# Patient Record
Sex: Male | Born: 1965 | Hispanic: No | Marital: Married | Smoking: Never smoker
Health system: Southern US, Community
[De-identification: ages and names within clinical notes are randomized; demographics above are authoritative.]

## PROBLEM LIST (undated history)

## (undated) DIAGNOSIS — G4730 Sleep apnea, unspecified: Secondary | ICD-10-CM

## (undated) DIAGNOSIS — I639 Cerebral infarction, unspecified: Secondary | ICD-10-CM

## (undated) DIAGNOSIS — I77810 Thoracic aortic ectasia: Secondary | ICD-10-CM

## (undated) DIAGNOSIS — I10 Essential (primary) hypertension: Secondary | ICD-10-CM

## (undated) DIAGNOSIS — Z8673 Personal history of transient ischemic attack (TIA), and cerebral infarction without residual deficits: Secondary | ICD-10-CM

## (undated) DIAGNOSIS — E119 Type 2 diabetes mellitus without complications: Secondary | ICD-10-CM

## (undated) HISTORY — DX: Cerebral infarction, unspecified: I63.9

## (undated) HISTORY — DX: Essential (primary) hypertension: I10

## (undated) HISTORY — DX: Type 2 diabetes mellitus without complications: E11.9

## (undated) HISTORY — DX: Personal history of transient ischemic attack (TIA), and cerebral infarction without residual deficits: Z86.73

## (undated) HISTORY — DX: Sleep apnea, unspecified: G47.30

## (undated) HISTORY — DX: Thoracic aortic ectasia: I77.810

---

## 2002-04-30 ENCOUNTER — Emergency Department (HOSPITAL_COMMUNITY): Admission: EM | Admit: 2002-04-30 | Discharge: 2002-04-30 | Payer: Self-pay

## 2009-07-15 ENCOUNTER — Emergency Department (HOSPITAL_COMMUNITY): Admission: EM | Admit: 2009-07-15 | Discharge: 2009-07-15 | Payer: Self-pay | Admitting: Family Medicine

## 2009-07-19 ENCOUNTER — Ambulatory Visit (HOSPITAL_COMMUNITY): Admission: RE | Admit: 2009-07-19 | Discharge: 2009-07-19 | Payer: Self-pay | Admitting: Urology

## 2010-04-17 LAB — COMPREHENSIVE METABOLIC PANEL
ALT: 29 U/L (ref 0–53)
AST: 24 U/L (ref 0–37)
Albumin: 4.4 g/dL (ref 3.5–5.2)
Alkaline Phosphatase: 90 U/L (ref 39–117)
BUN: 12 mg/dL (ref 6–23)
CO2: 24 mEq/L (ref 19–32)
Calcium: 9 mg/dL (ref 8.4–10.5)
Chloride: 108 mEq/L (ref 96–112)
Creatinine, Ser: 0.93 mg/dL (ref 0.4–1.5)
GFR calc Af Amer: >60 mL/min (ref >60)
GFR calc non Af Amer: >60 mL/min (ref >60)
Glucose, Bld: 111 mg/dL — ABNORMAL HIGH (ref 70–99)
Potassium: 3.2 mEq/L — ABNORMAL LOW (ref 3.5–5.1)
Sodium: 140 mEq/L (ref 135–145)
Total Bilirubin: 0.8 mg/dL (ref 0.3–1.2)
Total Protein: 8.1 g/dL (ref 6.0–8.3)

## 2010-04-17 LAB — POCT URINALYSIS DIP (DEVICE)
Bilirubin Urine: NEGATIVE
Glucose, UA: NEGATIVE mg/dL
Ketones, ur: NEGATIVE mg/dL
Nitrite: NEGATIVE
Protein, ur: NEGATIVE mg/dL
Specific Gravity, Urine: 1.015 (ref 1.005–1.030)
Urobilinogen, UA: 0.2 mg/dL (ref 0.0–1.0)
pH: 7.5 (ref 5.0–8.0)

## 2010-04-17 LAB — POCT I-STAT, CHEM 8
BUN: 12 mg/dL (ref 6–23)
Calcium, Ion: 1.06 mmol/L — ABNORMAL LOW (ref 1.12–1.32)
Chloride: 110 mEq/L (ref 96–112)
Creatinine, Ser: 1 mg/dL (ref 0.4–1.5)
Glucose, Bld: 113 mg/dL — ABNORMAL HIGH (ref 70–99)
HCT: 47 % (ref 39.0–52.0)
Hemoglobin: 16 g/dL (ref 13.0–17.0)
Potassium: 3.3 mEq/L — ABNORMAL LOW (ref 3.5–5.1)
Sodium: 142 mEq/L (ref 135–145)
TCO2: 26 mmol/L (ref 0–100)

## 2010-04-17 LAB — DIFFERENTIAL
Basophils Absolute: 0 10*3/uL (ref 0.0–0.1)
Basophils Relative: 0 % (ref 0–1)
Eosinophils Absolute: 0.6 10*3/uL (ref 0.0–0.7)
Eosinophils Relative: 7 % — ABNORMAL HIGH (ref 0–5)
Lymphocytes Relative: 30 % (ref 12–46)
Lymphs Abs: 2.7 10*3/uL (ref 0.7–4.0)
Monocytes Absolute: 0.5 10*3/uL (ref 0.1–1.0)
Monocytes Relative: 5 % (ref 3–12)
Neutro Abs: 5.3 10*3/uL (ref 1.7–7.7)
Neutrophils Relative %: 58 % (ref 43–77)

## 2010-04-17 LAB — CBC
HCT: 46 % (ref 39.0–52.0)
Hemoglobin: 15.7 g/dL (ref 13.0–17.0)
MCHC: 34.1 g/dL (ref 30.0–36.0)
MCV: 80.3 fL (ref 78.0–100.0)
Platelets: 269 10*3/uL (ref 150–400)
RBC: 5.73 MIL/uL (ref 4.22–5.81)
RDW: 13.4 % (ref 11.5–15.5)
WBC: 9.1 10*3/uL (ref 4.0–10.5)

## 2010-04-17 LAB — STONE ANALYSIS: Stone Weight KSTONE: 0.086 g

## 2010-04-17 LAB — LIPASE, BLOOD: Lipase: 29 U/L (ref 11–59)

## 2013-05-25 ENCOUNTER — Ambulatory Visit (INDEPENDENT_AMBULATORY_CARE_PROVIDER_SITE_OTHER): Payer: BC Managed Care – PPO | Admitting: Emergency Medicine

## 2013-05-25 ENCOUNTER — Ambulatory Visit: Payer: BC Managed Care – PPO

## 2013-05-25 VITALS — BP 208/130 | HR 85 | Temp 98.2°F | Resp 16 | Ht 62.0 in | Wt 157.4 lb

## 2013-05-25 DIAGNOSIS — M549 Dorsalgia, unspecified: Secondary | ICD-10-CM

## 2013-05-25 DIAGNOSIS — I10 Essential (primary) hypertension: Secondary | ICD-10-CM

## 2013-05-25 MED ORDER — HYDROCHLOROTHIAZIDE 12.5 MG PO CAPS: 12.5000 mg | ORAL_CAPSULE | Freq: Every day | ORAL | Status: DC

## 2013-05-25 MED ORDER — AMLODIPINE BESYLATE 5 MG PO TABS: 5.0000 mg | ORAL_TABLET | Freq: Every day | ORAL | Status: DC

## 2013-05-25 MED ORDER — CYCLOBENZAPRINE HCL 10 MG PO TABS: 10.0000 mg | ORAL_TABLET | Freq: Three times a day (TID) | ORAL | Status: DC | PRN

## 2013-05-25 MED ORDER — NAPROXEN SODIUM 550 MG PO TABS: 550.0000 mg | ORAL_TABLET | Freq: Two times a day (BID) | ORAL | Status: AC
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
## 2013-08-16 ENCOUNTER — Other Ambulatory Visit: Payer: Self-pay | Admitting: Emergency Medicine

## 2013-08-17 NOTE — Telephone Encounter (Signed)
Spoke with male who translated. Will have pt RTC-15 days sent

## 2015-02-25 ENCOUNTER — Observation Stay (HOSPITAL_COMMUNITY): Payer: BLUE CROSS/BLUE SHIELD

## 2015-02-25 ENCOUNTER — Emergency Department (HOSPITAL_COMMUNITY): Payer: BLUE CROSS/BLUE SHIELD

## 2015-02-25 ENCOUNTER — Encounter (HOSPITAL_COMMUNITY): Payer: Self-pay | Admitting: Emergency Medicine

## 2015-02-25 ENCOUNTER — Observation Stay (HOSPITAL_COMMUNITY)
Admission: EM | Admit: 2015-02-25 | Discharge: 2015-02-27 | Disposition: A | Discharge status: Home / Self Care | Payer: BLUE CROSS/BLUE SHIELD | Attending: Internal Medicine | Admitting: Internal Medicine

## 2015-02-25 DIAGNOSIS — G451 Carotid artery syndrome (hemispheric): Secondary | ICD-10-CM | POA: Diagnosis not present

## 2015-02-25 DIAGNOSIS — Z79899 Other long term (current) drug therapy: Secondary | ICD-10-CM | POA: Diagnosis not present

## 2015-02-25 DIAGNOSIS — I159 Secondary hypertension, unspecified: Secondary | ICD-10-CM

## 2015-02-25 DIAGNOSIS — I712 Thoracic aortic aneurysm, without rupture: Secondary | ICD-10-CM | POA: Insufficient documentation

## 2015-02-25 DIAGNOSIS — I160 Hypertensive urgency: Principal | ICD-10-CM | POA: Insufficient documentation

## 2015-02-25 DIAGNOSIS — I639 Cerebral infarction, unspecified: Secondary | ICD-10-CM

## 2015-02-25 DIAGNOSIS — R0789 Other chest pain: Secondary | ICD-10-CM | POA: Diagnosis not present

## 2015-02-25 DIAGNOSIS — I77810 Thoracic aortic ectasia: Secondary | ICD-10-CM | POA: Diagnosis present

## 2015-02-25 DIAGNOSIS — R079 Chest pain, unspecified: Secondary | ICD-10-CM | POA: Diagnosis present

## 2015-02-25 DIAGNOSIS — Z8673 Personal history of transient ischemic attack (TIA), and cerebral infarction without residual deficits: Secondary | ICD-10-CM

## 2015-02-25 DIAGNOSIS — R200 Anesthesia of skin: Secondary | ICD-10-CM | POA: Diagnosis not present

## 2015-02-25 DIAGNOSIS — Z9119 Patient's noncompliance with other medical treatment and regimen: Secondary | ICD-10-CM

## 2015-02-25 DIAGNOSIS — E876 Hypokalemia: Secondary | ICD-10-CM | POA: Diagnosis present

## 2015-02-25 DIAGNOSIS — G459 Transient cerebral ischemic attack, unspecified: Secondary | ICD-10-CM

## 2015-02-25 DIAGNOSIS — Z91199 Patient's noncompliance with other medical treatment and regimen due to unspecified reason: Secondary | ICD-10-CM

## 2015-02-25 DIAGNOSIS — M6289 Other specified disorders of muscle: Secondary | ICD-10-CM | POA: Diagnosis not present

## 2015-02-25 DIAGNOSIS — I10 Essential (primary) hypertension: Secondary | ICD-10-CM | POA: Diagnosis not present

## 2015-02-25 DIAGNOSIS — Z9114 Patient's other noncompliance with medication regimen: Secondary | ICD-10-CM | POA: Insufficient documentation

## 2015-02-25 DIAGNOSIS — R0602 Shortness of breath: Secondary | ICD-10-CM | POA: Diagnosis not present

## 2015-02-25 DIAGNOSIS — R059 Cough, unspecified: Secondary | ICD-10-CM | POA: Diagnosis present

## 2015-02-25 DIAGNOSIS — R531 Weakness: Secondary | ICD-10-CM | POA: Diagnosis present

## 2015-02-25 DIAGNOSIS — R05 Cough: Secondary | ICD-10-CM | POA: Diagnosis present

## 2015-02-25 HISTORY — DX: Essential (primary) hypertension: I10

## 2015-02-25 HISTORY — DX: Thoracic aortic ectasia: I77.810

## 2015-02-25 LAB — BASIC METABOLIC PANEL
Anion gap: 12 (ref 5–15)
BUN: 14 mg/dL (ref 6–20)
CO2: 22 mmol/L (ref 22–32)
Calcium: 9 mg/dL (ref 8.9–10.3)
Chloride: 105 mmol/L (ref 101–111)
Creatinine, Ser: 0.87 mg/dL (ref 0.61–1.24)
GFR calc Af Amer: >60 mL/min (ref >60)
GFR calc non Af Amer: >60 mL/min (ref >60)
Glucose, Bld: 116 mg/dL — ABNORMAL HIGH (ref 65–99)
Potassium: 3.3 mmol/L — ABNORMAL LOW (ref 3.5–5.1)
Sodium: 139 mmol/L (ref 135–145)

## 2015-02-25 LAB — CBC
HCT: 42.5 % (ref 39.0–52.0)
Hemoglobin: 14.5 g/dL (ref 13.0–17.0)
MCH: 25.4 pg — ABNORMAL LOW (ref 26.0–34.0)
MCHC: 34.1 g/dL (ref 30.0–36.0)
MCV: 74.6 fL — ABNORMAL LOW (ref 78.0–100.0)
Platelets: 351 10*3/uL (ref 150–400)
RBC: 5.7 MIL/uL (ref 4.22–5.81)
RDW: 12.8 % (ref 11.5–15.5)
WBC: 8.5 10*3/uL (ref 4.0–10.5)

## 2015-02-25 LAB — FERRITIN: Ferritin: 316 ng/mL (ref 24–336)

## 2015-02-25 LAB — TSH: TSH: 0.691 u[IU]/mL (ref 0.350–4.500)

## 2015-02-25 LAB — IRON AND TIBC
Iron: 82 ug/dL (ref 45–182)
Saturation Ratios: 23 % (ref 17.9–39.5)
TIBC: 364 ug/dL (ref 250–450)
UIBC: 282 ug/dL

## 2015-02-25 LAB — TROPONIN I
Troponin I: <0.03 ng/mL (ref <0.031)
Troponin I: <0.03 ng/mL (ref <0.031)

## 2015-02-25 LAB — BRAIN NATRIURETIC PEPTIDE: B Natriuretic Peptide: 39.2 pg/mL (ref 0.0–100.0)

## 2015-02-25 LAB — I-STAT TROPONIN, ED: Troponin i, poc: 0 ng/mL (ref 0.00–0.08)

## 2015-02-25 LAB — CBG MONITORING, ED: Glucose-Capillary: 112 mg/dL — ABNORMAL HIGH (ref 65–99)

## 2015-02-25 MED ORDER — LABETALOL HCL 5 MG/ML IV SOLN
20.0000 mg | Freq: Once | INTRAVENOUS | Status: AC
  Administered 2015-02-25: 20 mg via INTRAVENOUS
  Filled 2015-02-25: qty 4

## 2015-02-25 MED ORDER — AMLODIPINE BESYLATE 5 MG PO TABS
5.0000 mg | ORAL_TABLET | Freq: Every day | ORAL | Status: DC
  Administered 2015-02-25: 5 mg via ORAL
  Filled 2015-02-25: qty 1

## 2015-02-25 MED ORDER — IOHEXOL 350 MG/ML SOLN
100.0000 mL | Freq: Once | INTRAVENOUS | Status: AC | PRN
  Administered 2015-02-25: 100 mL via INTRAVENOUS

## 2015-02-25 MED ORDER — POTASSIUM CHLORIDE CRYS ER 20 MEQ PO TBCR
40.0000 meq | EXTENDED_RELEASE_TABLET | Freq: Once | ORAL | Status: AC
  Administered 2015-02-25: 40 meq via ORAL
  Filled 2015-02-25: qty 2

## 2015-02-25 MED ORDER — ASPIRIN 81 MG PO CHEW
324.0000 mg | CHEWABLE_TABLET | Freq: Once | ORAL | Status: AC
  Administered 2015-02-25: 324 mg via ORAL
  Filled 2015-02-25: qty 4

## 2015-02-25 MED ORDER — HYDRALAZINE HCL 20 MG/ML IJ SOLN
10.0000 mg | Freq: Four times a day (QID) | INTRAMUSCULAR | Status: DC | PRN
  Administered 2015-02-25: 10 mg via INTRAVENOUS
  Filled 2015-02-25: qty 1

## 2015-02-25 NOTE — ED Provider Notes (Signed)
CSN: [id number]     Arrival date & time 02/25/15  0940 History   First MD Initiated Contact with Patient 02/25/15 [phone]     Chief Complaint  Patient presents with  . Chest Pain     (Consider location/radiation/quality/duration/timing/severity/associated sxs/prior Treatment) HPI   Patient is a 50 year old male with past medical history of hypertension who presents to the ED with complaint of chest pain, onset 9 AM. Patient states while he was at work doing [employer] he began having left-sided chest tightness that he notes lasted for approximately 15 minutes. Denies radiation, denies any aggravating or alleviating factors. Patient notes during this episode he had associated diaphoresis, shortness of breath, lightheadedness, headache. He also reports having numbness and weakness to his right arm and leg which she notes has resolved since arrival to the ED. Denies fever, chills, cough, palpitations, abdominal pain, nausea, vomiting, diarrhea, urinary symptoms, syncope, seizure. Patient reports he has not been on his blood pressure medication for the past 2 years.  History reviewed. No pertinent past medical history. History reviewed. No pertinent past surgical history. No family history on file. Social History  Substance Use Topics  . Smoking status: Never Smoker   . Smokeless tobacco: None  . Alcohol Use: No    Review of Systems  Constitutional: Positive for diaphoresis.  Respiratory: Positive for chest tightness and shortness of breath.   Cardiovascular: Positive for chest pain.  Neurological: Positive for weakness, light-headedness, numbness and headaches.  All other systems reviewed and are negative.     Allergies  Review of patient's allergies indicates no known allergies.  Home Medications   Prior to Admission medications   Medication Sig Start Date End Date Taking? Authorizing Provider  amLODipine (NORVASC) 5 MG tablet take 1 tablet by mouth once daily   Yes Carmelina Dane, MD  guaiFENesin (MUCINEX) 600 MG 12 hr tablet Take 600 mg by mouth 2 (two) times daily.   Yes Historical Provider, MD  guaiFENesin (ROBITUSSIN) 100 MG/5ML liquid Take 200 mg by mouth 3 (three) times daily as needed for cough.   Yes Historical Provider, MD  hydrochlorothiazide (MICROZIDE) 12.5 MG capsule take 1 capsule by mouth once daily   Yes Carmelina Dane, MD  ibuprofen (ADVIL,MOTRIN) 200 MG tablet Take 200 mg by mouth every 6 (six) hours as needed for moderate pain.   Yes Historical Provider, MD  cyclobenzaprine (FLEXERIL) 10 MG tablet Take 1 tablet (10 mg total) by mouth 3 (three) times daily as needed for muscle spasms. Patient not taking: Reported on 02/25/2015 05/25/13   Carmelina Dane, MD   BP 184/96 mmHg  Pulse 80  Temp(Src) 97.6 F (36.4 C) (Oral)  Resp 21  SpO2 96% Physical Exam  Constitutional: He is oriented to person, place, and time. He appears well-developed and well-nourished. No distress.  HENT:  Head: Normocephalic and atraumatic.  Mouth/Throat: Oropharynx is clear and moist. No oropharyngeal exudate.  Eyes: Conjunctivae and EOM are normal. Pupils are equal, round, and reactive to light. Right eye exhibits no discharge. Left eye exhibits no discharge. No scleral icterus.  Neck: Normal range of motion. Neck supple.  Cardiovascular: Normal rate, regular rhythm, normal heart sounds and intact distal pulses.   Pulmonary/Chest: Effort normal and breath sounds normal. No respiratory distress. He has no wheezes. He has no rales. He exhibits no tenderness.  Abdominal: Soft. Bowel sounds are normal. He exhibits no distension and no mass. There is no tenderness. There is no rebound and no guarding.  Musculoskeletal: Normal range of motion. He exhibits no edema or tenderness.  Lymphadenopathy:    He has no cervical adenopathy.  Neurological: He is alert and oriented to person, place, and time. He has normal strength. No cranial nerve deficit or sensory deficit. He  displays a negative Romberg sign. Coordination normal.  Skin: Skin is warm and dry. He is not diaphoretic.  Nursing note and vitals reviewed.   ED Course  Procedures (including critical care time) Labs Review Labs Reviewed  BASIC METABOLIC PANEL - Abnormal; Notable for the following:    Potassium 3.3 (*)    Glucose, Bld 116 (*)    All other components within normal limits  CBC - Abnormal; Notable for the following:    MCV 74.6 (*)    MCH 25.4 (*)    All other components within normal limits  CBG MONITORING, ED - Abnormal; Notable for the following:    Glucose-Capillary 112 (*)    All other components within normal limits  TROPONIN I  TROPONIN I  TSH  I-STAT TROPOININ, ED    Imaging Review Dg Chest 2 View  02/25/2015  CLINICAL DATA:  Chest and left arm pain. EXAM: CHEST  2 VIEW COMPARISON:  None. FINDINGS: There is a torturous thoracic aorta with prominence of the ascending aorta. No pneumothorax. The heart, hila, and remainder of the mediastinum are normal. No pulmonary nodules, masses, or infiltrates. No other acute abnormalities. IMPRESSION: There is a tortuous and prominent ascending thoracic aorta. If there is concern for aortic pathology, a CTA of the chest could better evaluate. No other abnormalities. Electronically Signed   By: Gerome Sam III M.D   On: 02/25/2015 10:45   Ct Head Wo Contrast  02/25/2015  CLINICAL DATA:  Dizziness and right-sided weakness with inability to walk this morning. EXAM: CT HEAD WITHOUT CONTRAST TECHNIQUE: Contiguous axial images were obtained from the base of the skull through the vertex without intravenous contrast. COMPARISON:  None. FINDINGS: 1.3 cm triangular hypodensity peripherally in the left cerebellum image 8 series 2, suspicious for a remote infarct. There is likely some volume averaging along its lateral margin. Along the posterior inferior margin of the left lentiform nucleus, a 7 by 6 mm hypodensity suspicious for a remote lacunar  infarct is present, image 14 series 2. Otherwise, the brainstem, cerebellum, cerebral peduncles, thalami, basal ganglia, basilar cisterns, and ventricular system appear within normal limits. No intracranial hemorrhage, mass lesion, or acute CVA. Mild chronic ethmoid sinusitis observed. No middle ear fluid or mastoid effusion, although the mastoid air cells are aplastic on the left. IMPRESSION: 1. 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 Small suspected remote lacunar infarct in the left lentiform nucleus. Correlate with risk factors for cerebral vascular disease in determining need for further workup. I do not see any acute intracranial findings. 2. Mild chronic ethmoid sinusitis. Electronically Signed   By: Gaylyn Rong M.D.   On: 02/25/2015 11:15   Ct Angio Chest Aorta W/cm &/or Wo/cm  02/25/2015  CLINICAL DATA:  Palpitations. Right-sided weakness. Dizziness. Rule out dissection. EXAM: CT ANGIOGRAPHY CHEST WITH CONTRAST TECHNIQUE: Multidetector CT imaging of the chest was performed using the standard protocol during bolus administration of intravenous contrast. Multiplanar CT image reconstructions and MIPs were obtained to evaluate the vascular anatomy. CONTRAST:  OMNIPAQUE IOHEXOL 350 MG/ML SOLN COMPARISON:  07/16/2009 FINDINGS: Maximal aortic diameter at the sinus of Valsalva, sino-tubular junction, and ascending aorta are 4.4 cm, 4.0 cm, and 4.2 cm. No evidence of  dissection or intramural hematoma. No abnormal mediastinal adenopathy No obvious acute pulmonary thromboembolism. No acute vertebral body compression deformity. Review of the MIP images confirms the above findings. IMPRESSION: No evidence of aortic dissection. Mild aneurysmal dilatation of the ascending aorta at 4.2 cm. Recommend annual imaging followup by CTA or MRA. This recommendation follows 2010 ACCF/AHA/AATS/ACR/ASA/SCA/SCAI/SIR/STS/SVM Guidelines for the Diagnosis and Management of Patients with Thoracic  Aortic Disease. Circulation. 2010; 121: W098-J191 Electronically Signed   By: Jolaine Click M.D.   On: 02/25/2015 13:23   I have personally reviewed and evaluated these images and lab results as part of my medical decision-making.   EKG Interpretation None      MDM   Final diagnoses:  Chest pain  Secondary hypertension, unspecified    Pt presents with chest pain/tightness, diaphoresis, SOB, lightheadedness and HA that lasted appx. 15 min. He also endorses numbness and weakness to his right arm and leg during the episode also lasting appx. 15 min. Hx of uncontrolled HTN. Pt reports he has not been on his BP meds for about 2 years. Afebrile, HR 89, BP 202/116, O2 97% on RA. Exam unremarkable. No neuro deficits.   Pt given 324 ASA and  labetalol in the ED. EKG showed sinus rhythm. Trop negative. Potassium 3.3, labs otherwise unremarkable. CXR showed tortuous and prominent ascending thoracic aorta. CT head w/o showed suspected small old left posterior cerebellar infarct, small suspected remote lacunar infarct in left lentiform nucleaus, no acute findings. Plan to order CTA chest, neck and head.  12:10 - I spoke with the radiologist, Dr. Debe Coder, regarding the CT angio imaging ordered. He recommended that image the chest for a dissection and if any abnormality was seen in the thoracic aorta then the neck would also be included in the imaging. He advised that he did not think a CT angio head was recommended at this time based off the results from the CT head showing evidence of vascular disease, old infarct and no acute bleed.  CTA Chest Aorta showed no evidence of dissection, mild aneurysmal dilatation of ascending aorta. On reexamination BP decreased to 184/96. Plan to consult hospitalist for admission. I spoke with [facility], Junious Silk who agrees to admission with request to consult neuro. Orders placed for obs. Telemetry bed. Consulted neurology, I spoke with Dr. Lavon Paganini who will  evaluate the pt in the ED. Dr. Lavon Paganini discussed plan to further evaluate the pt for stroke workup. Discussed results and plan for admission with pt.    Satira Sark [place], [place] 02/25/15 1437  Loren Racer, MD 02/26/15 [phone]

## 2015-02-25 NOTE — Consult Note (Signed)
Requesting Physician: Dr. Malachi Bonds     Reason for consultation: Transient ischemic event  HPI:                                                                                                                                         Fred Brown is an 50 y.o. male patient who presented following a 15 minute episode of right upper lower extremity weakness and numbness. Symptoms have completely resolved and is a symptomatic at this time. Patient is [place] and speaks good English and able to provide history. He works in [employer]. He stopped taking his antihypertensive medications couple of months ago and reports that cost being a reason.  Date last known well:  02/25/15 Time last known well:  9 am tPA Given: No: Symptoms completely resolved  Stroke Risk Factors - hypertension  Past Medical History: History reviewed. No pertinent past medical history.  History reviewed. No pertinent past surgical history.  Family History: No family history on file.  Social History:   reports that he has never smoked. He does not have any smokeless tobacco history on file. He reports that he does not drink alcohol or use illicit drugs.  Allergies:  No Known Allergies   Medications:                                                                                                                         Current facility-administered medications:  .  amLODipine (NORVASC) tablet 5 mg, 5 mg, Oral, Daily, Russella Dar, NP, 5 mg at 02/25/15 1434 .  hydrALAZINE (APRESOLINE) injection 10 mg, 10 mg, Intravenous, Q6H PRN, Russella Dar, NP .  potassium chloride SA (K-DUR,KLOR-CON) CR tablet 40 mEq, 40 mEq, Oral, Once, Russella Dar, NP  Current outpatient prescriptions:  .  amLODipine (NORVASC) 5 MG tablet, take 1 tablet by mouth once daily, Disp: 15 tablet, Rfl: 0 .  guaiFENesin (MUCINEX) 600 MG 12 hr tablet, Take 600 mg by mouth 2 (two) times daily., Disp: , Rfl:  .  guaiFENesin (ROBITUSSIN) 100 MG/5ML  liquid, Take 200 mg by mouth 3 (three) times daily as needed for cough., Disp: , Rfl:  .  hydrochlorothiazide (MICROZIDE) 12.5 MG capsule, take 1 capsule by mouth once daily, Disp: 15 capsule, Rfl: 0 .  ibuprofen (ADVIL,MOTRIN) 200 MG tablet, Take 200 mg by mouth every 6 (six) hours as needed  for moderate pain., Disp: , Rfl:  .  cyclobenzaprine (FLEXERIL) 10 MG tablet, Take 1 tablet (10 mg total) by mouth 3 (three) times daily as needed for muscle spasms. (Patient not taking: Reported on 02/25/2015), Disp: 30 tablet, Rfl: 0   ROS:                                                                                                                                       History obtained from the patient  General ROS: negative for - chills, fatigue, fever, night sweats, weight gain or weight loss Psychological ROS: negative for - behavioral disorder, hallucinations, memory difficulties, mood swings or suicidal ideation Ophthalmic ROS: negative for - blurry vision, double vision, eye pain or loss of vision ENT ROS: negative for - epistaxis, nasal discharge, oral lesions, sore throat, tinnitus or vertigo Allergy and Immunology ROS: negative for - hives or itchy/watery eyes Hematological and Lymphatic ROS: negative for - bleeding problems, bruising or swollen lymph nodes Endocrine ROS: negative for - galactorrhea, hair pattern changes, polydipsia/polyuria or temperature intolerance Respiratory ROS: negative for - cough, hemoptysis, shortness of breath or wheezing Cardiovascular ROS: negative for - chest pain, dyspnea on exertion, edema or irregular heartbeat Gastrointestinal ROS: negative for - abdominal pain, diarrhea, hematemesis, nausea/vomiting or stool incontinence Genito-Urinary ROS: negative for - dysuria, hematuria, incontinence or urinary frequency/urgency Musculoskeletal ROS: negative for - joint swelling or muscular weakness Neurological ROS: as noted in HPI Dermatological ROS: negative for rash  and skin lesion changes  Neurologic Examination:                                                                                                      Blood pressure 193/113, pulse 85, temperature 97.6 F (36.4 C), temperature source Oral, resp. rate 23, SpO2 97 %.  Evaluation of higher integrative functions including: Level of alertness: Alert,  Oriented to time, place and person Recent and remote memory - intact   Attention span and concentration  - intact   Speech: fluent, no evidence of dysarthria or aphasia noted.  Test the following cranial nerves: 2-12 grossly intact Motor examination: Normal tone, bulk, full 5/5 motor strength in all 4 extremities Examination of sensation : Normal and symmetric sensation to pinprick in all 4 extremities and on face Examination of deep tendon reflexes: 2+, normal and symmetric in all extremities, normal plantars bilaterally Test coordination: Normal finger nose testing, with no evidence of limb appendicular ataxia or abnormal involuntary movements or tremors noted.  Gait: Normal   Lab Results: Basic Metabolic Panel:  Recent Labs Lab 02/25/15 0953  NA 139  K 3.3*  CL 105  CO2 22  GLUCOSE 116*  BUN 14  CREATININE 0.87  CALCIUM 9.0    Liver Function Tests: No results for input(s): AST, ALT, ALKPHOS, BILITOT, PROT, ALBUMIN in the last 168 hours. No results for input(s): LIPASE, AMYLASE in the last 168 hours. No results for input(s): AMMONIA in the last 168 hours.  CBC:  Recent Labs Lab 02/25/15 0953  WBC 8.5  HGB 14.5  HCT 42.5  MCV 74.6*  PLT 351    Cardiac Enzymes: No results for input(s): CKTOTAL, CKMB, CKMBINDEX, TROPONINI in the last 168 hours.  Lipid Panel: No results for input(s): CHOL, TRIG, HDL, CHOLHDL, VLDL, LDLCALC in the last 168 hours.  CBG:  Recent Labs Lab 02/25/15 1001  GLUCAP 112*    Microbiology: Results for orders placed or performed during the hospital encounter of 07/19/09  Stone analysis      Status: None   Collection Time: 07/19/09 10:45 AM  Result Value Ref Range Status   Component 1 KSTONE   Final    See Below (NOTE) Calcium Oxalate Dihydrate ([place]) 50% Carbonate Apatite ([place]) 50%   Stone Weight KSTONE 0.0860 g Final     Imaging: Dg Chest 2 View  02/25/2015  CLINICAL DATA:  Chest and left arm pain. EXAM: CHEST  2 VIEW COMPARISON:  None. FINDINGS: There is a torturous thoracic aorta with prominence of the ascending aorta. No pneumothorax. The heart, hila, and remainder of the mediastinum are normal. No pulmonary nodules, masses, or infiltrates. No other acute abnormalities. IMPRESSION: There is a tortuous and prominent ascending thoracic aorta. If there is concern for aortic pathology, a CTA of the chest could better evaluate. No other abnormalities. Electronically Signed   By: Gerome Sam III M.D   On: 02/25/2015 10:45   Ct Head Wo Contrast  02/25/2015  CLINICAL DATA:  Dizziness and right-sided weakness with inability to walk this morning. EXAM: CT HEAD WITHOUT CONTRAST TECHNIQUE: Contiguous axial images were obtained from the base of the skull through the vertex without intravenous contrast. COMPARISON:  None. FINDINGS: 1.3 cm triangular hypodensity peripherally in the left cerebellum image 8 series 2, suspicious for a remote infarct. There is likely some volume averaging along its lateral margin. Along the posterior inferior margin of the left lentiform nucleus, a 7 by 6 mm hypodensity suspicious for a remote lacunar infarct is present, image 14 series 2. Otherwise, the brainstem, cerebellum, cerebral peduncles, thalami, basal ganglia, basilar cisterns, and ventricular system appear within normal limits. No intracranial hemorrhage, mass lesion, or acute CVA. Mild chronic ethmoid sinusitis observed. No middle ear fluid or mastoid effusion, although the mastoid air cells are aplastic on the left. IMPRESSION: 1. 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 Small suspected remote lacunar infarct in the left lentiform nucleus. Correlate with risk factors for cerebral vascular disease in determining need for further workup. I do not see any acute intracranial findings. 2. Mild chronic ethmoid sinusitis. Electronically Signed   By: Gaylyn Rong M.D.   On: 02/25/2015 11:15   Ct Angio Chest Aorta W/cm &/or Wo/cm  02/25/2015  CLINICAL DATA:  Palpitations. Right-sided weakness. Dizziness. Rule out dissection. EXAM: CT ANGIOGRAPHY CHEST WITH CONTRAST TECHNIQUE: Multidetector CT imaging of the chest was performed using the standard protocol during bolus administration of intravenous contrast. Multiplanar CT image reconstructions and MIPs were obtained to evaluate  the vascular anatomy. CONTRAST:  OMNIPAQUE IOHEXOL 350 MG/ML SOLN COMPARISON:  07/16/2009 FINDINGS: Maximal aortic diameter at the sinus of Valsalva, sino-tubular junction, and ascending aorta are 4.4 cm, 4.0 cm, and 4.2 cm. No evidence of dissection or intramural hematoma. No abnormal mediastinal adenopathy No obvious acute pulmonary thromboembolism. No acute vertebral body compression deformity. Review of the MIP images confirms the above findings. IMPRESSION: No evidence of aortic dissection. Mild aneurysmal dilatation of the ascending aorta at 4.2 cm. Recommend annual imaging followup by CTA or MRA. This recommendation follows 2010 ACCF/AHA/AATS/ACR/ASA/SCA/SCAI/SIR/STS/SVM Guidelines for the Diagnosis and Management of Patients with Thoracic Aortic Disease. Circulation. 2010; 121: B147-W295 Electronically Signed   By: Jolaine Click M.D.   On: 02/25/2015 13:23    Assessment and plan:   Gurvir Schrom is an 50 y.o. male patient who presented to the ER following a 15 minute episode of right upper and lower extremity weakness and numbness. Symptoms are completely resolved, and is a symptomatic at this time. He does have poorly controlled hypertension with systolic blood pressures greater than 200 in  the ER. He stopped taking his antihypertensive medications 2 months ago, citing high cost. 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 Recommend further TIA workup with MRI of the brain, MRA of the head, ultrasound of the carotids, and transthoracic echocardiogram with bubble study. We will be placed on cardiac telemetry to rule out atrial fibrillation. Check A1c, lipid profile. Start aspirin 325 mg daily.  Neurology service will continue to follow up during her hospitalization. Please call for any further questions.

## 2015-02-25 NOTE — ED Notes (Signed)
Patient sent off the off by another staff member.

## 2015-02-25 NOTE — Progress Notes (Signed)
Attending I have personally interviewed and examined the patient and the treatment plan was discussed with the physician extender.  I have directly reviewed the clinical findings, lab results, imaging studies of this patient.  I have reviewed and agree with the documentation as recorded by the physician extender with any exceptions or changes to the plan listed below.    The patient is a 50 year old male with history of hypertension, medication noncompliance who presents with left-sided chest pain and an episode of sudden onset difficult moving the right arm and right leg.  In the emergency department, his blood pressure was initially 219/147.  Labs were notable for mild hypokalemia. Initial troponin was negative, EKG demonstrated no acute ischemic changes, NSR.  Chest x-ray demonstrated a tortuous aorta without acute abnormality and follow-up CT angiogram chest demonstrate no evidence of aortic dissection. 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   Gen:  Adult male, NAD HEENT:  NCAT, PERRL, MMM CV:  RRR, no mrg PULM:  CTAB ABD:  NABS, soft, ND/NT MSK:  No LEE, normal tone and bulk Neuro:  CN II-XII grossly intact, strength 5/5 throughout, sensation intact to light touch, no dysmetria, stable gait.    Assessment/plan: Malignant hypertension, blood pressure trending down after 1 dose of IV labetalol in emergency permit. Patient will be admitted observation for blood pressure management. Cycle troponins, resume previous blood pressure medications.  Will also work up for TIA/stroke given transient neurologic symptoms with evidence of previous strokes on CT head with MRI/MRA brain, ECHO, carotid duplex, PT/OT/SLP.  Bedside swallow evaluation.  Start aspirin and high dose  statin.  Mild microcytosis without anemia.  Check iron studies and occult stool.

## 2015-02-25 NOTE — ED Notes (Signed)
PA at bedside. Patient now reporting some right sided numbness this am that has now resolved to just pain.

## 2015-02-25 NOTE — Care Management Note (Signed)
Case Management Note  Patient Details  Name: Lennie Vasco MRN: [id number] Date of Birth: 07/01/1965  Subjective/Objective:    CP with history of hypertension who has not bee consistent with complying with medical treatments. He reports that he has not taking any medication for high blood pressure in at least 2 years.                Action/Plan: CM met with patient at bedside to discuss discharge planning. Patient denies having a PCP.Informed patient of the [facility] and the services rendered for follow up patient is agreeable with establishing care at the Clinic. Appointment scheduled for Friday 2/3 at 9:30 am patient made aware that he is eligible to utilize [facility] pharmacy upon discharge from hospital hours of operation is M-F 9-5:30p teach back done patient verbalized understanding. Patient made aware that CM available for any further questions or concernis  Expected Discharge Date:                  Expected Discharge Plan:  Home/Self Care  In-House Referral:     Discharge planning Services  CM Consult, Follow-up appt scheduled, [place] Acute Care Choice:  NA Choice offered to:  Patient  DME Arranged:  N/A DME Agency:  NA  HH Arranged:  NA HH Agency:  NA  Status of Service:     Medicare Important Message Given:    Date Medicare IM Given:    Medicare IM give by:    Date Additional Medicare IM Given:    Additional Medicare Important Message give by:     If discussed at [place] of Stay Meetings, dates discussed:    Additional CommentsLaurena Slimmer, RN 02/25/2015, 10:00 PM

## 2015-02-25 NOTE — ED Notes (Signed)
Patient states L side chest pain with no radiation this morning.   Patient states is now resolved.  Patient states had dizziness with same.   Patient also has headache.   Patient states R side pain from shoulder down to ankle.   Neuro WNL.  No facial asymmetry, no droop, no weakness in extremities, no slurred speech.

## 2015-02-25 NOTE — H&P (Signed)
[facility] History and Physical                                                                                    Carron Fred Brown, is a 50 y.o. male  MRN: [id number]   DOB - 04-Feb-1965  Admit Date - 02/25/2015  Outpatient Primary MD for the patient is No primary care provider on file.  Referring MD: Criss Alvine / ER  PMH: History reviewed. No pertinent past medical history.    PSH: History reviewed. No pertinent past surgical history.   CC:  Chief Complaint  Patient presents with  . Chest Pain     HPI: This is a 50 year old male patient with an apparent long-standing history of hypertension who is been consistent with complying with medical treatments. He reports that he has not taking any medication for high blood pressure in at least 2 years. He reports that he thought that since his blood pressure was controlled and he felt better he didn't need to take medications. He has insurance and previously has been on Norvasc and hydrochlorothiazide. Patient presents to the ER today with multiple complaints. He initially complained of chest pain that began around 9 AM. This was substernal in nature and radiated down his left arm and was associated with exertion at work. This was associated with shortness of breath and diaphoresis. In addition he was also having right-sided numbness and tingling stating he could not hold a hammer at work and was having blurry vision and was very dizzy. He states he has been having chest pain for many years when his blood pressure is up. He denies dyspnea on exertion or orthopnea. He does report productive cough with yellow sputum over the past 2 weeks. Has taken over-the-counter medications including Mucinex without improvement in symptoms. He has been having subjective fevers. Patient currently reports previous numbness and tingling has resolved.  ER Evaluation and treatment: Afebrile with a BP of 219/47, heart rate was 104 and respirations were 20 with room  air saturations 97%. 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 1.3 cm with a small suspected remote lacunar infarct in the left lentiform nucleus CT angiogram chest in a order with an without contrast: No aortic dissection, mild aneurysmal dilatation at the ascending aorta measuring 4.2 cm Labs: K + 3.3, troponin 0.00, hemoglobin 14.5, BUN 14 and creatinine 0.87 Aspirin 324 mg by mouth 1 Labetalol 20 g IV 1  Review of Systems   In addition to the HPI above,  No chills, myalgias or other constitutional symptoms No Headache, changes with Vision or hearing, new weakness, tingling, numbness in any extremity, No problems swallowing food or Liquids, indigestion/reflux No palpitations, orthopnea or DOE No Abdominal pain, N/V; no melena or hematochezia, no dark tarry stools No dysuria, hematuria or flank pain No new skin rashes, lesions, masses or bruises, No new joints pains-aches No recent weight gain or loss No polyuria, polydypsia or polyphagia,  *A full 10 point Review  of Systems was done, except as stated above, all other Review of Systems were negative.  Social History Social History  Substance Use Topics  . Smoking status: Never Smoker   . Smokeless tobacco: Not on file  . Alcohol Use: No    Resides at: Private residence  Lives with: Girlfriend  Ambulatory status: Without assistive devices   Family History No family history on file. patient is uncertain about family history when questioned regarding diabetes, MI, hypertension, stroke or other major medical problems. Patient thinks his mother may have had high blood pressure   Prior to Admission medications   Medication Sig Start Date End Date Taking? Authorizing Provider    amLODipine (NORVASC) 5 MG tablet take 1 tablet by mouth once daily   Yes Carmelina Dane, MD  guaiFENesin (MUCINEX) 600 MG 12 hr tablet Take 600 mg by mouth 2 (two) times daily.   Yes Historical Provider, MD  guaiFENesin (ROBITUSSIN) 100 MG/5ML liquid Take 200 mg by mouth 3 (three) times daily as needed for cough.   Yes Historical Provider, MD  hydrochlorothiazide (MICROZIDE) 12.5 MG capsule take 1 capsule by mouth once daily   Yes Carmelina Dane, MD  ibuprofen (ADVIL,MOTRIN) 200 MG tablet Take 200 mg by mouth every 6 (six) hours as needed for moderate pain.   Yes Historical Provider, MD  cyclobenzaprine (FLEXERIL) 10 MG tablet Take 1 tablet (10 mg total) by mouth 3 (three) times daily as needed for muscle spasms. Patient not taking: Reported on 02/25/2015 05/25/13   Carmelina Dane, MD    No Known Allergies  Physical Exam  Vitals  Blood pressure 193/113, pulse 85, temperature 97.6 F (36.4 C), temperature source Oral, resp. rate 23, SpO2 97 %.   General:  In no acute distress, appears healthy and well nourished  Psych:  Normal affect, Denies Suicidal or Homicidal ideations, Awake Alert, Oriented X 3. Speech and thought patterns are clear and appropriate, no apparent short term memory deficits-of note slight language barrier patient is [place] and speaks [place] dialect  Neuro:   No focal neurological deficits, CN II through XII intact, Strength 5/5 all 4 extremities, Sensation intact all 4 extremities.  ENT:  Ears and Eyes appear Normal, Conjunctivae clear, PER. Moist oral mucosa without erythema or exudates.  Neck:  Supple, No lymphadenopathy appreciated  Respiratory:  Symmetrical chest wall movement, Good air movement bilaterally, CTAB but coarse without definitive crackles wheezes or rhonchi Room Air  Cardiac:  RRR, No Murmurs, no LE edema noted, no JVD, No carotid bruits, peripheral pulses palpable at 2+  Abdomen:  Positive bowel sounds, Soft, Non tender, Non  distended,  No masses appreciated, no obvious hepatosplenomegaly  Skin:  No Cyanosis, Normal Skin Turgor, No Skin Rash or Bruise.  Extremities: Symmetrical without obvious trauma or injury,  no effusions.  Data Review  CBC  Recent Labs Lab 02/25/15 0953  WBC 8.5  HGB 14.5  HCT 42.5  PLT 351  MCV 74.6*  MCH 25.4*  MCHC 34.1  RDW 12.8    Chemistries   Recent Labs Lab 02/25/15 0953  NA 139  K 3.3*  CL 105  CO2 22  GLUCOSE 116*  BUN 14  CREATININE 0.87  CALCIUM 9.0    CrCl cannot be calculated (Unknown ideal weight.).  No results for input(s): TSH, T4TOTAL, T3FREE, THYROIDAB in the last 72 hours.  Invalid input(s): FREET3  Coagulation profile No results for input(s): INR, PROTIME in the last 168 hours.  No results for input(s):  DDIMER in the last 72 hours.  Cardiac Enzymes No results for input(s): CKMB, TROPONINI, MYOGLOBIN in the last 168 hours.  Invalid input(s): CK  Invalid input(s): POCBNP  Urinalysis    Component Value Date/Time   LABSPEC 1.015 07/15/2009 1024   PHURINE 7.5 07/15/2009 1024   GLUCOSEU NEGATIVE 07/15/2009 1024   HGBUR MODERATE* 07/15/2009 1024   BILIRUBINUR NEGATIVE 07/15/2009 1024   KETONESUR NEGATIVE 07/15/2009 1024   PROTEINUR NEGATIVE 07/15/2009 1024   UROBILINOGEN 0.2 07/15/2009 1024   NITRITE NEGATIVE 07/15/2009 1024   LEUKOCYTESUR  07/15/2009 1024    NEGATIVE Biochemical Testing Only. Please order routine urinalysis from main lab if confirmatory testing is needed.    Imaging results:   Dg Chest 2 View  02/25/2015  CLINICAL DATA:  Chest and left arm pain. EXAM: CHEST  2 VIEW COMPARISON:  None. FINDINGS: There is a torturous thoracic aorta with prominence of the ascending aorta. No pneumothorax. The heart, hila, and remainder of the mediastinum are normal. No pulmonary nodules, masses, or infiltrates. No other acute abnormalities. IMPRESSION: There is a tortuous and prominent ascending thoracic aorta. If there is concern  for aortic pathology, a CTA of the chest could better evaluate. No other abnormalities. Electronically Signed   By: Gerome Sam III M.D   On: 02/25/2015 10:45   Ct Head Wo Contrast  02/25/2015  CLINICAL DATA:  Dizziness and right-sided weakness with inability to walk this morning. EXAM: CT HEAD WITHOUT CONTRAST TECHNIQUE: Contiguous axial images were obtained from the base of the skull through the vertex without intravenous contrast. COMPARISON:  None. FINDINGS: 1.3 cm triangular hypodensity peripherally in the left cerebellum image 8 series 2, suspicious for a remote infarct. There is likely some volume averaging along its lateral margin. Along the posterior inferior margin of the left lentiform nucleus, a 7 by 6 mm hypodensity suspicious for a remote lacunar infarct is present, image 14 series 2. Otherwise, the brainstem, cerebellum, cerebral peduncles, thalami, basal ganglia, basilar cisterns, and ventricular system appear within normal limits. No intracranial hemorrhage, mass lesion, or acute CVA. Mild chronic ethmoid sinusitis observed. No middle ear fluid or mastoid effusion, although the mastoid air cells are aplastic on the left. IMPRESSION: 1. 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 Small suspected remote lacunar infarct in the left lentiform nucleus. Correlate with risk factors for cerebral vascular disease in determining need for further workup. I do not see any acute intracranial findings. 2. Mild chronic ethmoid sinusitis. Electronically Signed   By: Gaylyn Rong M.D.   On: 02/25/2015 11:15   Ct Angio Chest Aorta W/cm &/or Wo/cm  02/25/2015  CLINICAL DATA:  Palpitations. Right-sided weakness. Dizziness. Rule out dissection. EXAM: CT ANGIOGRAPHY CHEST WITH CONTRAST TECHNIQUE: Multidetector CT imaging of the chest was performed using the standard protocol during bolus administration of intravenous contrast. Multiplanar CT image reconstructions and MIPs were obtained  to evaluate the vascular anatomy. CONTRAST:  OMNIPAQUE IOHEXOL 350 MG/ML SOLN COMPARISON:  07/16/2009 FINDINGS: Maximal aortic diameter at the sinus of Valsalva, sino-tubular junction, and ascending aorta are 4.4 cm, 4.0 cm, and 4.2 cm. No evidence of dissection or intramural hematoma. No abnormal mediastinal adenopathy No obvious acute pulmonary thromboembolism. No acute vertebral body compression deformity. Review of the MIP images confirms the above findings. IMPRESSION: No evidence of aortic dissection. Mild aneurysmal dilatation of the ascending aorta at 4.2 cm. Recommend annual imaging followup by CTA or MRA. This recommendation follows 2010 ACCF/AHA/AATS/ACR/ASA/SCA/SCAI/SIR/STS/SVM Guidelines for the Diagnosis and Management  of Patients with Thoracic Aortic Disease. Circulation. 2010; 121: Z610-R604 Electronically Signed   By: Jolaine Click M.D.   On: 02/25/2015 13:23     EKG: (Independently reviewed)  Normal sinus rhythm with ventricular rate 99 bpm, QTC 464 ms, no definitive ischemic changes noted   Assessment & Plan  Principal Problem:   Numbness on right side/ History of CVA (remote and previously undiagnosed) -Today apparently initial symptomatology of right sided numbness-CT with incidental finding of what appears to be remote CVA i.e. 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  Active Problems:   Hypertensive urgency -Blood pressure still not well controlled -Consider permissive hypertension keeping systolic blood pressure >/= 160 -Norvasc 5 mg by mouth daily -prn Apresoline for systolic blood pressure greater than  180    Chest pain -Seems to be more reflection of hypertension -Cycle troponin -EKG nonischemic -Follow up on echocardiogram regarding regional wall motion abnormalities/LV systolic dysfunction     Patient nonadherence -Patient reports did not understand that he needed to continuously take antihypertensive medications -Discussed need for regular medications and follow up / need to establish with PCP at length with patient -Patient is from [place] with primary dialect [place] and requests translator to go over plan of care once established prior to discharge    Hypokalemia -Oral replete    Cough -Reported intermittent coughing with productive yellow sputum and subjective fevers over the past 2 weeks -Was utilizing mucolytic's over-the-counter -Currently no focal infiltrate on chest x-ray and the leukocytosis so no indication to begin antibiotic therapy at this juncture    Low MCV -Is not have anemia will check indices as precaution    Ascending aorta dilatation  -Incidental finding on CT of the chest and likely related to long-standing uncontrolled hypertension -Echocardiogram should assist in providing accurate measurements clarification of degree of dilatation    DVT Prophylaxis: Lovenox  Family Communication:  No family at bedside   Code Status:  Full code  Condition:  Stable  Discharge disposition: Anticipate discharge back to home environment once medically stable  Time spent in minutes : 60      Seve Monette L. ANP on 02/25/2015 at 2:53 PM  You may contact me by going to [email or web address] - password [email or web address]  I am available from 7a-7p but please confirm I am on the schedule by going to Amion as above.   After 7p please contact night coverage person covering me after hours  [facility]

## 2015-02-26 ENCOUNTER — Observation Stay (HOSPITAL_BASED_OUTPATIENT_CLINIC_OR_DEPARTMENT_OTHER): Payer: BLUE CROSS/BLUE SHIELD

## 2015-02-26 DIAGNOSIS — R200 Anesthesia of skin: Secondary | ICD-10-CM | POA: Diagnosis not present

## 2015-02-26 DIAGNOSIS — G459 Transient cerebral ischemic attack, unspecified: Secondary | ICD-10-CM | POA: Diagnosis not present

## 2015-02-26 DIAGNOSIS — G458 Other transient cerebral ischemic attacks and related syndromes: Secondary | ICD-10-CM

## 2015-02-26 LAB — LIPID PANEL
Cholesterol: 227 mg/dL — ABNORMAL HIGH (ref 0–200)
HDL: 44 mg/dL (ref >40)
LDL Cholesterol: 161 mg/dL — ABNORMAL HIGH (ref 0–99)
Total CHOL/HDL Ratio: 5.2 RATIO
Triglycerides: 110 mg/dL (ref <150)
VLDL: 22 mg/dL (ref 0–40)

## 2015-02-26 LAB — TROPONIN I: Troponin I: <0.03 ng/mL (ref <0.031)

## 2015-02-26 MED ORDER — ASPIRIN 300 MG RE SUPP: 300.0000 mg | Freq: Every day | RECTAL | Status: DC

## 2015-02-26 MED ORDER — HYDROCHLOROTHIAZIDE 12.5 MG PO CAPS
12.5000 mg | ORAL_CAPSULE | Freq: Every day | ORAL | Status: DC
Start: 2015-02-26 — End: 2015-02-27

## 2015-02-26 MED ORDER — CARVEDILOL 6.25 MG PO TABS
6.2500 mg | ORAL_TABLET | Freq: Two times a day (BID) | ORAL | Status: DC
  Administered 2015-02-26 (×2): 6.25 mg via ORAL
  Filled 2015-02-26 (×2): qty 1

## 2015-02-26 MED ORDER — STROKE: EARLY STAGES OF RECOVERY BOOK
Freq: Once | Status: AC
  Administered 2015-02-26: 10:00:00
  Filled 2015-02-26: qty 1

## 2015-02-26 MED ORDER — AMLODIPINE BESYLATE 10 MG PO TABS
10.0000 mg | ORAL_TABLET | Freq: Every day | ORAL | Status: DC
  Administered 2015-02-26: 10 mg via ORAL
  Filled 2015-02-26: qty 1

## 2015-02-26 MED ORDER — ASPIRIN 325 MG PO TABS: 325.0000 mg | ORAL_TABLET | Freq: Every day | ORAL | Status: DC

## 2015-02-26 MED ORDER — ASPIRIN 325 MG PO TABS
325.0000 mg | ORAL_TABLET | Freq: Every day | ORAL | Status: DC
  Administered 2015-02-26: 325 mg via ORAL
  Filled 2015-02-26: qty 1

## 2015-02-26 MED ORDER — CARVEDILOL 6.25 MG PO TABS
6.2500 mg | ORAL_TABLET | Freq: Two times a day (BID) | ORAL | Status: DC
Start: 2015-02-26 — End: 2015-02-27

## 2015-02-26 MED ORDER — AMLODIPINE BESYLATE 10 MG PO TABS: 10.0000 mg | ORAL_TABLET | Freq: Every day | ORAL | Status: DC

## 2015-02-26 MED ORDER — ENOXAPARIN SODIUM 40 MG/0.4ML ~~LOC~~ SOLN
40.0000 mg | SUBCUTANEOUS | Status: DC
  Administered 2015-02-26: 40 mg via SUBCUTANEOUS
  Filled 2015-02-26: qty 0.4

## 2015-02-26 NOTE — Progress Notes (Signed)
  Echocardiogram 2D Echocardiogram has been performed.  Arvil Chaco 02/26/2015, 10:48 AM

## 2015-02-26 NOTE — Progress Notes (Signed)
Subjective: Back to baseline with no complaints.   Exam: Filed Vitals:   02/26/15 0559 02/26/15 1013  BP: 153/92 169/100  Pulse: 84 100  Temp: 97.5 F (36.4 C)   Resp: 18       Gen: In bed, NAD MS: alert and oriented CN: PERRLA, EOMI, TML, FACE equal Motor: 5/5 throughout Sensory:intact throughout   Pertinent Labs: LDL, A1c pending Echo pending Carotid pending  Felicie Morn PA-C [facility] [phone]  Impression: 50 y.o. male patient who presented to the ER following a 15 minute episode of right upper and lower extremity weakness and numbness. Symptoms are completely resolved. Currently undergoing TIA work up.    Recommendations: 1) continue TIA work up.     02/26/2015, 10:48 AM

## 2015-02-26 NOTE — Progress Notes (Signed)
PT Cancellation Note  Patient Details Name: Arick Mareno MRN: [id number] DOB: 07-29-1965   Cancelled Treatment:    Reason Eval/Treat Not Completed: PT screened, no needs identified, will sign off. Pt independent and back to baseline per OT.   Beecher Furio 02/26/2015, 3:41 PM [facility] PT [phone]

## 2015-02-26 NOTE — Evaluation (Signed)
Occupational Therapy Evaluation and Discharge Summary Patient Details Name: Fran Mcree MRN: [id number] DOB: May 25, 1965 Today's Date: 02/26/2015    History of Present Illness Pt is a 50 yo male admitted with TIA symptoms including blurry vision, numbness in R arm to where he could not use a hammer at work, chest pain with numbness radiating down L arm as well.  Work up in progress.     Clinical Impression   Pt admitted with the above diagnosis and overall seems to have returned to baseline and is independent with all mobility and adls. Signs and symptoms of a CVA were reviewed.  Will d/c pt from acute OT.    Follow Up Recommendations  No OT follow up    Equipment Recommendations  None recommended by OT    Recommendations for Other Services       Precautions / Restrictions Precautions Precautions: None Restrictions Weight Bearing Restrictions: No      Mobility Bed Mobility Overal bed mobility: Independent                Transfers Overall transfer level: Independent Equipment used: None             General transfer comment: no assist needed    Balance Overall balance assessment: Independent                                          ADL Overall ADL's : Independent                                       General ADL Comments: Pt back to baseline and independent.     Vision Vision Assessment?: No apparent visual deficits Additional Comments: Pts vision has returned to baseline.     Perception Perception Perception Tested?: No   Praxis Praxis Praxis tested?: Within functional limits    Pertinent Vitals/Pain Pain Assessment: No/denies pain     Hand Dominance Right   Extremity/Trunk Assessment Upper Extremity Assessment Upper Extremity Assessment: Overall WFL for tasks assessed   Lower Extremity Assessment Lower Extremity Assessment: Overall WFL for tasks assessed   Cervical / Trunk Assessment Cervical / Trunk  Assessment: Normal   Communication Communication Communication: Prefers language other than [place];Other (comment) (does well understanding English with gestures.)   Cognition Arousal/Alertness: Awake/alert Behavior During Therapy: WFL for tasks assessed/performed Overall Cognitive Status: Within Functional Limits for tasks assessed                     General Comments       Exercises       Shoulder Instructions      Home Living Family/patient expects to be discharged to:: Private residence Living Arrangements: Alone Available Help at Discharge: Family Type of Home: House                       Home Equipment: None          Prior Functioning/Environment Level of Independence: Independent             OT Diagnosis:     OT Problem List:     OT Treatment/Interventions:      OT Goals(Current goals can be found in the care plan section) Acute Rehab OT Goals Patient Stated Goal: to go home OT  Goal Formulation: All assessment and education complete, DC therapy  OT Frequency:     Barriers to D/C:            Co-evaluation              End of Session Nurse Communication: Mobility status  Activity Tolerance: Patient tolerated treatment well Patient left: in bed;with call bell/phone within reach;with family/visitor present   Time: 1110-1125 OT Time Calculation (min): 15 min Charges:  OT General Charges $OT Visit: 1 Procedure OT Evaluation $OT Eval Low Complexity: 1 Procedure G-Codes: OT G-codes **NOT FOR INPATIENT CLASS** Functional Assessment Tool Used: clinical judgement Functional Limitation: Self care Self Care Current Status (A5409): 0 percent impaired, limited or restricted Self Care Goal Status (W1191): 0 percent impaired, limited or restricted Self Care Discharge Status (Y7829): 0 percent impaired, limited or restricted  Hope Budds 02/26/2015, 11:29 AM  [phone]

## 2015-02-26 NOTE — Evaluation (Signed)
Speech Language Pathology Evaluation Patient Details Name: Fred Brown MRN: [id number] DOB: 01-31-1966 Today's Date: 02/26/2015 Time: 1050-1105 SLP Time Calculation (min) (ACUTE ONLY): 15 min  Problem List:  Patient Active Problem List   Diagnosis Date Noted  . Numbness on right side 02/25/2015  . Hypertensive urgency 02/25/2015  . Chest pain 02/25/2015  . Patient nonadherence 02/25/2015  . History of CVA (remote and previously undiagnosed) 02/25/2015  . Hypokalemia 02/25/2015  . Cough 02/25/2015  . Ascending aorta dilatation (HCC) 02/25/2015  . Acute right-sided weakness 02/25/2015   Past Medical History: History reviewed. No pertinent past medical history. Past Surgical History: History reviewed. No pertinent past surgical history. HPI:  Patient is a 50 year old [place] male who was admitted to [facility] with c/o chest pains, blurry vision, numbness in R arm to where he could not use a hammer at work, numbness radiating down L arm as well. Patient has a long h/o HTN and per patient report, he had taken prescribed medications for his HTN, but after about 3 months, he felt better. He apparently did not know that he was supposed to continue taking medications long term.   Assessment / Plan / Recommendation Clinical Impression  Patient's overall cognitive-linguistic and speech function are all within normal limits. Patient's primary language is a Counselling psychologist dialect of [place]) but he has been living in the [place] for 15 years. SLP judged his comprehension and ability to communicate in [place] as fluent, and patient did not require translator for this assessment. Patient verbalized that he understands now that he is supposed to continue taking the prescribed medication for his HTN, "for the rest of my life" and it appears that his previous medical noncompliance was accidental, resulting from his misunderstanding. Although patient was able to communicate and comprehenend very well in English,  recommend that interpreter is used for any complex level information, such as medical interventions and medications to unsure patient fully understands risk/benefit as well as dosage, etc.     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   Cognition  Overall Cognitive Status: Within Functional Limits for tasks assessed Orientation Level: Oriented X4 Memory: Appears intact Awareness: Appears intact Problem Solving: Appears intact    Comprehension  Auditory Comprehension Overall Auditory Comprehension: Appears within functional limits for tasks assessed Reading Comprehension Reading Status: Not tested    Expression Expression Primary Mode of Expression: Verbal Verbal Expression Overall Verbal Expression: Appears within functional limits for tasks assessed Written Expression Dominant Hand: Right   Oral / Motor  Oral Motor/Sensory Function Overall Oral Motor/Sensory Function: Within functional limits Motor Speech Overall Motor Speech: Appears within functional limits for tasks assessed   GO          Functional Limitations: Spoken language expressive Spoken Language Comprehension Current Status [phone]): 0 percent impaired, limited or restricted Spoken Language Comprehension Goal Status ([id number]): 0 percent impaired, limited or restricted Spoken Language Comprehension Discharge Status [phone]): 0 percent impaired, limited or restricted Spoken Language Expression Current Status (O1308): 0 percent impaired, limited or restricted Spoken Language Expression Goal Status (M5784): 0 percent impaired, limited or restricted Spoken Language  Expression Discharge Status [phone]):  0 percent impaired, limited or restricted Other Speech-Language Pathology Functional Limitation [phone]): 0 percent impaired, limited or restricted         Fred Brown 02/26/2015, 12:05 PM  Fred Nevin, MA, CCC-SLP 02/26/2015 12:05 PM

## 2015-02-26 NOTE — Progress Notes (Signed)
Patient Demographics:    Fred Brown, is a 50 y.o. male, DOB - 1966/01/23, ZOX:[id number]  Admit date - 02/25/2015   Admitting Physician Renae Fickle, MD  Outpatient Primary MD for the patient is No primary care provider on file.  LOS -    Chief Complaint  Patient presents with  . Chest Pain        Subjective:    Fred Brown today has, No headache, No chest pain, No abdominal pain - No Nausea, No new weakness tingling or numbness, No Cough - SOB.    Assessment  & Plan :     1. Hypertensive urgency. Much improved, counseled on compliance with medication, placed on Norvasc and Coreg along with as needed hydralazine. Set up at wellness clinic. Prescriptions mailed to wellness clinic.  2. Atypical chest pain. Likely due to #1 above. Resolved. Troponin negative, EKG nonacute, continue blood pressure control, await echogram.  3. Hypokalemia. Replaced. We will recheck.  4. Incidental finding of ascending aortic aneurysm. Outpatient follow-up with PCP and Cardiothoracic surgery.  5. Episode of generalized weakness. Seen by neurology. MRI MRA nonacute, pending echo-carotid ultrasound-A1c and lipid profile. Back to baseline symptoms resolved, they were likely due to hypertensive urgency.    Code Status : Full  Family Communication  : None present  Disposition Plan  : Home in the morning  Consults  :  Neuro  Procedures  :   CT angiogram chest. Ascending aortic aneurysm 4 cm.  MRI MRA brain nonacute  DVT Prophylaxis  :  Lovenox   Lab Results  Component Value Date   PLT 351 02/25/2015    Inpatient Medications  Scheduled Meds: . amLODipine  10 mg Oral Daily  . aspirin  325 mg Oral Daily  . carvedilol  6.25 mg Oral BID WC  . enoxaparin (LOVENOX) injection  40 mg Subcutaneous Q24H    Continuous Infusions:  PRN Meds:.hydrALAZINE  Antibiotics  :     Anti-infectives    None        Objective:   Filed Vitals:   02/26/15 0010 02/26/15 0233 02/26/15 0559 02/26/15 1013  BP: 162/104 155/105 153/92 169/100  Pulse: 84 99 84 100  Temp:   97.5 F (36.4 C)   TempSrc:   Oral   Resp:   18   Height:      Weight:      SpO2:   98%     Wt Readings from Last 3 Encounters:  02/25/15 69.9 kg (154 lb 1.6 oz)  05/25/13 71.396 kg (157 lb 6.4 oz)     Intake/Output Summary (Last 24 hours) at 02/26/15 1034 Last data filed at 02/26/15 0645  Gross per 24 hour  Intake    240 ml  Output      3 ml  Net    237 ml     Physical Exam  Awake Alert, Oriented X 3, No new F.N deficits, Normal affect [place].AT,PERRAL Supple Neck,No JVD, No cervical lymphadenopathy appriciated.  Symmetrical Chest wall movement, Good air movement bilaterally, CTAB RRR,No Gallops,Rubs or new Murmurs, No Parasternal Heave +ve B.Sounds, Abd Soft, No tenderness, No organomegaly appriciated, No rebound - guarding or rigidity. No Cyanosis, Clubbing or edema, No new Rash or bruise  Data Review:   Micro Results No results found for this or any previous visit (from the past 240 hour(s)).  Radiology Reports Dg Chest 2 View  02/25/2015  CLINICAL DATA:  Chest and left arm pain. EXAM: CHEST  2 VIEW COMPARISON:  None. FINDINGS: There is a torturous thoracic aorta with prominence of the ascending aorta. No pneumothorax. The heart, hila, and remainder of the mediastinum are normal. No pulmonary nodules, masses, or infiltrates. No other acute abnormalities. IMPRESSION: There is a tortuous and prominent ascending thoracic aorta. If there is concern for aortic pathology, a CTA of the chest could better evaluate. No other abnormalities. Electronically Signed   By: Gerome Sam III M.D   On: 02/25/2015 10:45   Ct Head Wo Contrast  02/25/2015  CLINICAL DATA:  Dizziness and right-sided weakness with inability  to walk this morning. EXAM: CT HEAD WITHOUT CONTRAST TECHNIQUE: Contiguous axial images were obtained from the base of the skull through the vertex without intravenous contrast. COMPARISON:  None. FINDINGS: 1.3 cm triangular hypodensity peripherally in the left cerebellum image 8 series 2, suspicious for a remote infarct. There is likely some volume averaging along its lateral margin. Along the posterior inferior margin of the left lentiform nucleus, a 7 by 6 mm hypodensity suspicious for a remote lacunar infarct is present, image 14 series 2. Otherwise, the brainstem, cerebellum, cerebral peduncles, thalami, basal ganglia, basilar cisterns, and ventricular system appear within normal limits. No intracranial hemorrhage, mass lesion, or acute CVA. Mild chronic ethmoid sinusitis observed. No middle ear fluid or mastoid effusion, although the mastoid air cells are aplastic on the left. IMPRESSION: 1. 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 Small suspected remote lacunar infarct in the left lentiform nucleus. Correlate with risk factors for cerebral vascular disease in determining need for further workup. I do not see any acute intracranial findings. 2. Mild chronic ethmoid sinusitis. Electronically Signed   By: Gaylyn Rong M.D.   On: 02/25/2015 11:15   Mr Shirlee Latch Wo Contrast  02/25/2015  CLINICAL DATA:  49 year old male with right side weakness and dizziness. Initial encounter. EXAM: MRI HEAD WITHOUT CONTRAST MRA HEAD WITHOUT CONTRAST TECHNIQUE: Multiplanar, multiecho pulse sequences of the brain and surrounding structures were obtained without intravenous contrast. Angiographic images of the head were obtained using MRA technique without contrast. COMPARISON:  Head CT 1056 hours today. FINDINGS: MRI HEAD FINDINGS No restricted diffusion to suggest acute infarction. No midline shift, mass effect, evidence of mass lesion, ventriculomegaly, extra-axial collection or acute intracranial  hemorrhage. Cervicomedullary junction and pituitary are within normal limits. Negative visualized cervical spine. Major intracranial vascular flow voids are within normal limits. Small chronic infarct in the left cerebellum (series 10, image 15) with hemosiderin corresponding to the CT finding earlier today. T2 and FLAIR heterogeneity in the bilateral basal ganglia does appear most likely due to multiple bilateral chronic lacunar infarcts (series 7, image 17). Occasional superimposed corona radiata chronic lacunar infarcts (series 7, image 20). No supratentorial cortical encephalomalacia. Scattered chronic micro hemorrhages in the brain including the left corona radiata (series 9, image 72). Grossly normal visualized internal auditory structures. Mastoids are clear. Small maxillary sinus mucous retention cyst. Orbit and scalp soft tissues are within normal limits. Normal bone marrow signal. MRA HEAD FINDINGS Antegrade flow in the posterior circulation with codominant distal vertebral arteries. No distal vertebral artery stenosis. Normal PICA origins. Incidental fenestrated basilar or artery origin (normal anatomic variation). Mild basilar artery irregularity without stenosis. SCA and PCA origins  are within normal limits. Both posterior communicating arteries are present. Bilateral PCA branches are mildly irregular, without stenosis. Antegrade flow in both ICA siphons. Proximal cavernous ICA irregularity without stenosis. Ophthalmic and posterior communicating artery origins are normal. Patent carotid termini. MCA and ACA origins are within normal limits. Diminutive or absent anterior communicating artery. Visualized bilateral ACA branches are within normal limits. Right MCA M1 segment is within normal limits. There is mild to moderate regularity dominant right MCA M2 branch along its proximal segment. Right MCA bifurcation and other visualized right MCA branches otherwise normal. IMPRESSION: 1.  No acute  intracranial abnormality. 2. 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 3. Intracranial MRA remarkable for age advanced intracranial atherosclerosis (including involvement of the mid basilar artery, ICA siphons, right MCA and PCA branches) with no intracranial stenosis or major circle of Willis branch occlusion identified. Electronically Signed   By: Odessa Fleming M.D.   On: 02/25/2015 18:54   Mr Brain Wo Contrast  02/25/2015  CLINICAL DATA:  50 year old male with right side weakness and dizziness. Initial encounter. EXAM: MRI HEAD WITHOUT CONTRAST MRA HEAD WITHOUT CONTRAST TECHNIQUE: Multiplanar, multiecho pulse sequences of the brain and surrounding structures were obtained without intravenous contrast. Angiographic images of the head were obtained using MRA technique without contrast. COMPARISON:  Head CT 1056 hours today. FINDINGS: MRI HEAD FINDINGS No restricted diffusion to suggest acute infarction. No midline shift, mass effect, evidence of mass lesion, ventriculomegaly, extra-axial collection or acute intracranial hemorrhage. Cervicomedullary junction and pituitary are within normal limits. Negative visualized cervical spine. Major intracranial vascular flow voids are within normal limits. Small chronic infarct in the left cerebellum (series 10, image 15) with hemosiderin corresponding to the CT finding earlier today. T2 and FLAIR heterogeneity in the bilateral basal ganglia does appear most likely due to multiple bilateral chronic lacunar infarcts (series 7, image 17). Occasional superimposed corona radiata chronic lacunar infarcts (series 7, image 20). No supratentorial cortical encephalomalacia. Scattered chronic micro hemorrhages in the brain including the left corona radiata (series 9, image 72). Grossly normal visualized internal auditory  structures. Mastoids are clear. Small maxillary sinus mucous retention cyst. Orbit and scalp soft tissues are within normal limits. Normal bone marrow signal. MRA HEAD FINDINGS Antegrade flow in the posterior circulation with codominant distal vertebral arteries. No distal vertebral artery stenosis. Normal PICA origins. Incidental fenestrated basilar or artery origin (normal anatomic variation). Mild basilar artery irregularity without stenosis. SCA and PCA origins are within normal limits. Both posterior communicating arteries are present. Bilateral PCA branches are mildly irregular, without stenosis. Antegrade flow in both ICA siphons. Proximal cavernous ICA irregularity without stenosis. Ophthalmic and posterior communicating artery origins are normal. Patent carotid termini. MCA and ACA origins are within normal limits. Diminutive or absent anterior communicating artery. Visualized bilateral ACA branches are within normal limits. Right MCA M1 segment is within normal limits. There is mild to moderate regularity dominant right MCA M2 branch along its proximal segment. Right MCA bifurcation and other visualized right MCA branches otherwise normal. IMPRESSION: 1.  No acute intracranial abnormality. 2. 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 3. Intracranial MRA remarkable for age advanced intracranial atherosclerosis (including involvement of the mid basilar artery, ICA siphons, right MCA and PCA branches) with no intracranial stenosis or major  circle of Willis branch occlusion identified. Electronically Signed   By: Odessa Fleming M.D.   On: 02/25/2015 18:54   Ct Angio Chest Aorta W/cm &/or Wo/cm  02/25/2015  CLINICAL DATA:  Palpitations. Right-sided weakness. Dizziness. Rule out dissection. EXAM: CT ANGIOGRAPHY CHEST WITH CONTRAST TECHNIQUE: Multidetector CT  imaging of the chest was performed using the standard protocol during bolus administration of intravenous contrast. Multiplanar CT image reconstructions and MIPs were obtained to evaluate the vascular anatomy. CONTRAST:  OMNIPAQUE IOHEXOL 350 MG/ML SOLN COMPARISON:  07/16/2009 FINDINGS: Maximal aortic diameter at the sinus of Valsalva, sino-tubular junction, and ascending aorta are 4.4 cm, 4.0 cm, and 4.2 cm. No evidence of dissection or intramural hematoma. No abnormal mediastinal adenopathy No obvious acute pulmonary thromboembolism. No acute vertebral body compression deformity. Review of the MIP images confirms the above findings. IMPRESSION: No evidence of aortic dissection. Mild aneurysmal dilatation of the ascending aorta at 4.2 cm. Recommend annual imaging followup by CTA or MRA. This recommendation follows 2010 ACCF/AHA/AATS/ACR/ASA/SCA/SCAI/SIR/STS/SVM Guidelines for the Diagnosis and Management of Patients with Thoracic Aortic Disease. Circulation. 2010; 121: Z610-R604 Electronically Signed   By: Jolaine Click M.D.   On: 02/25/2015 13:23     CBC  Recent Labs Lab 02/25/15 0953  WBC 8.5  HGB 14.5  HCT 42.5  PLT 351  MCV 74.6*  MCH 25.4*  MCHC 34.1  RDW 12.8    Chemistries   Recent Labs Lab 02/25/15 0953  NA 139  K 3.3*  CL 105  CO2 22  GLUCOSE 116*  BUN 14  CREATININE 0.87  CALCIUM 9.0   ------------------------------------------------------------------------------------------------------------------ No results for input(s): CHOL, HDL, LDLCALC, TRIG, CHOLHDL, LDLDIRECT in the last 72 hours.  No results found for: HGBA1C ------------------------------------------------------------------------------------------------------------------  Recent Labs  02/25/15 1439  TSH 0.691   ------------------------------------------------------------------------------------------------------------------  Recent Labs  02/25/15 1430  FERRITIN 316  TIBC 364  IRON 82     Coagulation profile No results for input(s): INR, PROTIME in the last 168 hours.  No results for input(s): DDIMER in the last 72 hours.  Cardiac Enzymes  Recent Labs Lab 02/25/15 1628 02/25/15 2257 02/26/15 0418  TROPONINI <0.03 <0.03 <0.03   ------------------------------------------------------------------------------------------------------------------    Component Value Date/Time   BNP 39.2 02/25/2015 1439    Time Spent in minutes   35   Fred Brown K M.D on 02/26/2015 at 10:34 AM  Between 7am to 7pm - Pager - [phone]  After 7pm go to [email or web address] - password [facility]  [facility] -  Office  [phone]

## 2015-02-27 ENCOUNTER — Observation Stay (HOSPITAL_BASED_OUTPATIENT_CLINIC_OR_DEPARTMENT_OTHER): Payer: BLUE CROSS/BLUE SHIELD

## 2015-02-27 DIAGNOSIS — R200 Anesthesia of skin: Secondary | ICD-10-CM | POA: Diagnosis not present

## 2015-02-27 DIAGNOSIS — I639 Cerebral infarction, unspecified: Secondary | ICD-10-CM | POA: Diagnosis not present

## 2015-02-27 LAB — BASIC METABOLIC PANEL
Anion gap: 9 (ref 5–15)
BUN: 19 mg/dL (ref 6–20)
CO2: 24 mmol/L (ref 22–32)
Calcium: 9.1 mg/dL (ref 8.9–10.3)
Chloride: 106 mmol/L (ref 101–111)
Creatinine, Ser: 0.94 mg/dL (ref 0.61–1.24)
GFR calc Af Amer: >60 mL/min (ref >60)
GFR calc non Af Amer: >60 mL/min (ref >60)
Glucose, Bld: 121 mg/dL — ABNORMAL HIGH (ref 65–99)
Potassium: 3.8 mmol/L (ref 3.5–5.1)
Sodium: 139 mmol/L (ref 135–145)

## 2015-02-27 LAB — HEMOGLOBIN A1C
Hgb A1c MFr Bld: 6.2 % — ABNORMAL HIGH (ref 4.8–5.6)
Mean Plasma Glucose: 131 mg/dL

## 2015-02-27 MED ORDER — ATORVASTATIN CALCIUM 20 MG PO TABS
20.0000 mg | ORAL_TABLET | Freq: Every day | ORAL | Status: DC
Start: 2015-02-27 — End: 2015-02-27

## 2015-02-27 MED ORDER — GUAIFENESIN-DM 100-10 MG/5ML PO SYRP
5.0000 mL | ORAL_SOLUTION | ORAL | Status: DC | PRN
  Administered 2015-02-27: 5 mL via ORAL
  Filled 2015-02-27: qty 5

## 2015-02-27 MED ORDER — ATORVASTATIN CALCIUM 20 MG PO TABS: 20.0000 mg | ORAL_TABLET | Freq: Every day | ORAL | Status: DC

## 2015-02-27 MED ORDER — AMLODIPINE BESYLATE 10 MG PO TABS: 10.0000 mg | ORAL_TABLET | Freq: Every day | ORAL | Status: DC

## 2015-02-27 MED ORDER — CARVEDILOL 6.25 MG PO TABS: 6.2500 mg | ORAL_TABLET | Freq: Two times a day (BID) | ORAL | Status: DC

## 2015-02-27 MED ORDER — HYDROCHLOROTHIAZIDE 12.5 MG PO CAPS: 12.5000 mg | ORAL_CAPSULE | Freq: Every day | ORAL | Status: DC

## 2015-02-27 MED ORDER — ASPIRIN 325 MG PO TABS: 325.0000 mg | ORAL_TABLET | Freq: Every day | ORAL | Status: DC

## 2015-02-27 NOTE — Progress Notes (Signed)
*  PRELIMINARY RESULTS* Vascular Ultrasound Carotid Duplex (Doppler) has been completed.  There is no obvious evidence of hemodynamically significant internal carotid artery stenosis bilaterally. Vertebral arteries are patent with antegrade flow.  02/27/2015 10:54 AM Gertie Fey, RVT, RDCS, RDMS

## 2015-02-27 NOTE — Discharge Summary (Signed)
Fred Brown, is a 50 y.o. male  DOB [birth year]  MRN [id number].  Admission date:  02/25/2015  Admitting Physician  Renae Fickle, MD  Discharge Date:  02/27/2015   Primary MD  No primary care provider on file.  Recommendations for primary care physician for things to follow:   Monitor secondary to his factors for stroke, monitor blood pressure, cholesterol closely.   Admission Diagnosis  TIA (transient ischemic attack) [G45.9] Chest pain [R07.9] Numbness on right side [R20.0] Secondary hypertension, unspecified [I15.9]   Discharge Diagnosis  TIA (transient ischemic attack) [G45.9] Chest pain [R07.9] Numbness on right side [R20.0] Secondary hypertension, unspecified [I15.9]     Principal Problem:   Numbness on right side Active Problems:   Hypertensive urgency   Chest pain   Patient nonadherence   History of CVA (remote and previously undiagnosed)   Hypokalemia   Cough   Ascending aorta dilatation (HCC)   Acute right-sided weakness      History reviewed. No pertinent past medical history.  History reviewed. No pertinent past surgical history.     HPI  from the history and physical done on the day of admission:   This is a 50 year old male patient with an apparent long-standing history of hypertension who is been consistent with complying with medical treatments. He reports that he has not taking any medication for high blood pressure in at least 2 years. He reports that he thought that since his blood pressure was controlled and he felt better he didn't need to take medications. He has insurance and previously has been on Norvasc and hydrochlorothiazide. Patient presents to the ER today with multiple complaints. He initially complained of chest pain that began around 9 AM. This was substernal in nature  and radiated down his left arm and was associated with exertion at work. This was associated with shortness of breath and diaphoresis. In addition he was also having right-sided numbness and tingling stating he could not hold a hammer at work and was having blurry vision and was very dizzy. He states he has been having chest pain for many years when his blood pressure is up. He denies dyspnea on exertion or orthopnea.   He does report productive cough with yellow sputum over the past 2 weeks. Has taken over-the-counter medications including Mucinex without improvement in symptoms. He has been having subjective fevers. Patient currently reports previous numbness and tingling has resolved.     Hospital Course:     1. Hypertensive urgency. Much improved, counseled on compliance with medication, placed on Norvasc and Coreg with good control. Set up at wellness clinic. Prescriptions mailed to wellness clinic.  2. Atypical chest pain. Likely due to #1 above. Resolved. Troponin negative, EKG nonacute, continue blood pressure control, stable echogram.  3. Hypokalemia. Replaced.   4. Incidental finding of ascending aortic aneurysm. Outpatient follow-up with PCP and Cardiothoracic surgery.  5. Episode of generalized weakness. Seen by neurology. MRI MRA nonacute, stable echogram, A1c, carotid duplex, LDL was above 70 and he has been placed  on statin, placed on aspirin by neurology has TIA could not be completely ruled out although unlikely based on his clinical presentation. Back to baseline symptoms resolved, they were likely due to hypertensive urgency.     Discharge Condition: Stable  Follow UP  Follow-up Information    Follow up with [facility] On 03/05/2015.   Why:  Appointment scheduled forfollow up and to establish care at the [facility] Friday  2/3 at 9:30 am with Dr. Carleene Overlie information:   [address] [place] [address] [phone]      Follow up with Loreli Slot, MD. Schedule an appointment as soon as possible for a visit in 1 week.   Specialty:  Cardiothoracic Surgery   Why:  Ascending aortic aneurysm   Contact information:   [address] [employer] [address] [place] [address] [phone]        Consults obtained - Neuro  Diet and Activity recommendation: See Discharge Instructions below  Discharge Instructions       Discharge Instructions    Diet - low sodium heart healthy    Complete by:  As directed      Discharge instructions    Complete by:  As directed   Follow with Primary MD  in 7 days   Get CBC, CMP, 2 view Chest X ray checked  by Primary MD next visit.    Activity: As tolerated with Full fall precautions use walker/cane & assistance as needed   Disposition Home     Diet:   Heart Healthy   For Heart failure patients - Check your Weight same time everyday, if you gain over 2 pounds, or you develop in leg swelling, experience more shortness of breath or chest pain, call your Primary MD immediately. Follow Cardiac Low Salt Diet and 1.5 lit/day fluid restriction.   On your next visit with your primary care physician please Get Medicines reviewed and adjusted.   Please request your Prim.MD to go over all Hospital Tests and Procedure/Radiological results at the follow up, please get all Hospital records sent to your Prim MD by signing hospital release before you go home.   If you experience worsening of your admission symptoms, develop shortness of breath, life threatening emergency, suicidal or homicidal thoughts you must seek medical attention immediately by calling 911 or calling your MD immediately  if symptoms less severe.  You Must read complete instructions/literature along with all the possible adverse reactions/side effects for all the Medicines you take and that have been prescribed to you. Take any new Medicines after you have completely  understood and accpet all the possible adverse reactions/side effects.   Do not drive, operating heavy machinery, perform activities at heights, swimming or participation in water activities or provide baby sitting services if your were admitted for syncope or siezures until you have seen by Primary MD or a Neurologist and advised to do so again.  Do not drive when taking Pain medications.    Do not take more than prescribed Pain, Sleep and Anxiety Medications  Special Instructions: If you have smoked or chewed Tobacco  in the last 2 yrs please stop smoking, stop any regular Alcohol  and or any Recreational drug use.  Wear Seat belts while driving.   Please note  You were cared for by a hospitalist during your hospital stay. If you have any questions about your discharge medications or the care you  received while you were in the hospital after you are discharged, you can call the unit and asked to speak with the hospitalist on call if the hospitalist that took care of you is not available. Once you are discharged, your primary care physician will handle any further medical issues. Please note that NO REFILLS for any discharge medications will be authorized once you are discharged, as it is imperative that you return to your primary care physician (or establish a relationship with a primary care physician if you do not have one) for your aftercare needs so that they can reassess your need for medications and monitor your lab values.     Increase activity slowly    Complete by:  As directed              Discharge Medications       Medication List    STOP taking these medications        cyclobenzaprine 10 MG tablet  Commonly known as:  FLEXERIL     guaiFENesin 100 MG/5ML liquid  Commonly known as:  ROBITUSSIN     guaiFENesin 600 MG 12 hr tablet  Commonly known as:  MUCINEX     ibuprofen 200 MG tablet  Commonly known as:  ADVIL,MOTRIN      TAKE these medications         amLODipine 10 MG tablet  Commonly known as:  NORVASC  Take 1 tablet (10 mg total) by mouth daily.     aspirin 325 MG tablet  Take 1 tablet (325 mg total) by mouth daily.     atorvastatin 20 MG tablet  Commonly known as:  LIPITOR  Take 1 tablet (20 mg total) by mouth daily at 6 PM.     carvedilol 6.25 MG tablet  Commonly known as:  COREG  Take 1 tablet (6.25 mg total) by mouth 2 (two) times daily with a meal.     hydrochlorothiazide 12.5 MG capsule  Commonly known as:  MICROZIDE  Take 1 capsule (12.5 mg total) by mouth daily.        Major procedures and Radiology Reports - PLEASE review detailed and final reports for all details, in brief -   TTE  Left ventricle: The cavity size was normal. There was mildconcentric hypertrophy. Systolic function was normal. Theestimated ejection fraction was in the range of 60% to 65%. Wallmotion was normal; there were no regional wall motionabnormalities. Doppler parameters are consistent with abnormal left ventricular relaxation (grade 1 diastolic dysfunction). - Aortic valve: There was mild regurgitation. - Left atrium: The atrium was mildly dilated. - Right atrium: The atrium was mildly dilated. - Atrial septum: No defect or patent foramen ovale was identified. Echo contrast study showed no right-to-left atrial level shunt, at baseline or with provocation.  Carotid US  There is no obvious evidence of hemodynamically significant internal carotid artery stenosis bilaterally. Vertebral arteries are patent with antegrade flow.   Dg Chest 2 View  02/25/2015  CLINICAL DATA:  Chest and left arm pain. EXAM: CHEST  2 VIEW COMPARISON:  None. FINDINGS: There is a torturous thoracic aorta with prominence of the ascending aorta. No pneumothorax. The heart, hila, and remainder of the mediastinum are normal. No pulmonary nodules, masses, or infiltrates. No other acute abnormalities. IMPRESSION: There is a tortuous and prominent ascending thoracic  aorta. If there is concern for aortic pathology, a CTA of the chest could better evaluate. No other abnormalities. Electronically Signed   By: Gerome Sam III M.D  On: 02/25/2015 10:45   Ct Head Wo Contrast  02/25/2015  CLINICAL DATA:  Dizziness and right-sided weakness with inability to walk this morning. EXAM: CT HEAD WITHOUT CONTRAST TECHNIQUE: Contiguous axial images were obtained from the base of the skull through the vertex without intravenous contrast. COMPARISON:  None. FINDINGS: 1.3 cm triangular hypodensity peripherally in the left cerebellum image 8 series 2, suspicious for a remote infarct. There is likely some volume averaging along its lateral margin. Along the posterior inferior margin of the left lentiform nucleus, a 7 by 6 mm hypodensity suspicious for a remote lacunar infarct is present, image 14 series 2. Otherwise, the brainstem, cerebellum, cerebral peduncles, thalami, basal ganglia, basilar cisterns, and ventricular system appear within normal limits. No intracranial hemorrhage, mass lesion, or acute CVA. Mild chronic ethmoid sinusitis observed. No middle ear fluid or mastoid effusion, although the mastoid air cells are aplastic on the left. IMPRESSION: 1. 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 Small suspected remote lacunar infarct in the left lentiform nucleus. Correlate with risk factors for cerebral vascular disease in determining need for further workup. I do not see any acute intracranial findings. 2. Mild chronic ethmoid sinusitis. Electronically Signed   By: Gaylyn Rong M.D.   On: 02/25/2015 11:15   Mr Shirlee Latch Wo Contrast  02/25/2015  CLINICAL DATA:  50 year old male with right side weakness and dizziness. Initial encounter. EXAM: MRI HEAD WITHOUT CONTRAST MRA HEAD WITHOUT CONTRAST TECHNIQUE: Multiplanar, multiecho pulse sequences of the brain and surrounding structures were obtained without intravenous contrast. Angiographic images of the head  were obtained using MRA technique without contrast. COMPARISON:  Head CT 1056 hours today. FINDINGS: MRI HEAD FINDINGS No restricted diffusion to suggest acute infarction. No midline shift, mass effect, evidence of mass lesion, ventriculomegaly, extra-axial collection or acute intracranial hemorrhage. Cervicomedullary junction and pituitary are within normal limits. Negative visualized cervical spine. Major intracranial vascular flow voids are within normal limits. Small chronic infarct in the left cerebellum (series 10, image 15) with hemosiderin corresponding to the CT finding earlier today. T2 and FLAIR heterogeneity in the bilateral basal ganglia does appear most likely due to multiple bilateral chronic lacunar infarcts (series 7, image 17). Occasional superimposed corona radiata chronic lacunar infarcts (series 7, image 20). No supratentorial cortical encephalomalacia. Scattered chronic micro hemorrhages in the brain including the left corona radiata (series 9, image 72). Grossly normal visualized internal auditory structures. Mastoids are clear. Small maxillary sinus mucous retention cyst. Orbit and scalp soft tissues are within normal limits. Normal bone marrow signal. MRA HEAD FINDINGS Antegrade flow in the posterior circulation with codominant distal vertebral arteries. No distal vertebral artery stenosis. Normal PICA origins. Incidental fenestrated basilar or artery origin (normal anatomic variation). Mild basilar artery irregularity without stenosis. SCA and PCA origins are within normal limits. Both posterior communicating arteries are present. Bilateral PCA branches are mildly irregular, without stenosis. Antegrade flow in both ICA siphons. Proximal cavernous ICA irregularity without stenosis. Ophthalmic and posterior communicating artery origins are normal. Patent carotid termini. MCA and ACA origins are within normal limits. Diminutive or absent anterior communicating artery. Visualized bilateral ACA  branches are within normal limits. Right MCA M1 segment is within normal limits. There is mild to moderate regularity dominant right MCA M2 branch along its proximal segment. Right MCA bifurcation and other visualized right MCA branches otherwise normal. IMPRESSION: 1.  No acute intracranial abnormality. 2. 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 3. Intracranial MRA remarkable for age advanced intracranial atherosclerosis (including involvement of the mid basilar artery, ICA siphons, right MCA and PCA branches) with no intracranial stenosis or major circle of Willis branch occlusion identified. Electronically Signed   By: Odessa Fleming M.D.   On: 02/25/2015 18:54   Mr Brain Wo Contrast  02/25/2015  CLINICAL DATA:  50 year old male with right side weakness and dizziness. Initial encounter. EXAM: MRI HEAD WITHOUT CONTRAST MRA HEAD WITHOUT CONTRAST TECHNIQUE: Multiplanar, multiecho pulse sequences of the brain and surrounding structures were obtained without intravenous contrast. Angiographic images of the head were obtained using MRA technique without contrast. COMPARISON:  Head CT 1056 hours today. FINDINGS: MRI HEAD FINDINGS No restricted diffusion to suggest acute infarction. No midline shift, mass effect, evidence of mass lesion, ventriculomegaly, extra-axial collection or acute intracranial hemorrhage. Cervicomedullary junction and pituitary are within normal limits. Negative visualized cervical spine. Major intracranial vascular flow voids are within normal limits. Small chronic infarct in the left cerebellum (series 10, image 15) with hemosiderin corresponding to the CT finding earlier today. T2 and FLAIR heterogeneity in the bilateral basal ganglia does appear most likely due to multiple bilateral chronic lacunar infarcts (series 7, image 17).  Occasional superimposed corona radiata chronic lacunar infarcts (series 7, image 20). No supratentorial cortical encephalomalacia. Scattered chronic micro hemorrhages in the brain including the left corona radiata (series 9, image 72). Grossly normal visualized internal auditory structures. Mastoids are clear. Small maxillary sinus mucous retention cyst. Orbit and scalp soft tissues are within normal limits. Normal bone marrow signal. MRA HEAD FINDINGS Antegrade flow in the posterior circulation with codominant distal vertebral arteries. No distal vertebral artery stenosis. Normal PICA origins. Incidental fenestrated basilar or artery origin (normal anatomic variation). Mild basilar artery irregularity without stenosis. SCA and PCA origins are within normal limits. Both posterior communicating arteries are present. Bilateral PCA branches are mildly irregular, without stenosis. Antegrade flow in both ICA siphons. Proximal cavernous ICA irregularity without stenosis. Ophthalmic and posterior communicating artery origins are normal. Patent carotid termini. MCA and ACA origins are within normal limits. Diminutive or absent anterior communicating artery. Visualized bilateral ACA branches are within normal limits. Right MCA M1 segment is within normal limits. There is mild to moderate regularity dominant right MCA M2 branch along its proximal segment. Right MCA bifurcation and other visualized right MCA branches otherwise normal. IMPRESSION: 1.  No acute intracranial abnormality. 2. 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 3. Intracranial MRA remarkable for age advanced intracranial atherosclerosis (including involvement of the mid basilar artery, ICA siphons, right MCA and PCA branches) with no intracranial stenosis or major circle of Willis branch occlusion  identified. Electronically Signed   By: Odessa Fleming M.D.   On: 02/25/2015 18:54   Ct Angio Chest Aorta W/cm &/or Wo/cm  02/25/2015  CLINICAL DATA:  Palpitations. Right-sided weakness. Dizziness. Rule out dissection. EXAM: CT ANGIOGRAPHY CHEST WITH CONTRAST TECHNIQUE: Multidetector CT imaging of the chest was performed using the standard protocol during bolus administration of intravenous contrast. Multiplanar CT image reconstructions and MIPs were obtained to evaluate the vascular anatomy. CONTRAST:  OMNIPAQUE IOHEXOL 350 MG/ML SOLN COMPARISON:  07/16/2009 FINDINGS: Maximal aortic diameter at the sinus of Valsalva, sino-tubular junction, and ascending aorta are 4.4 cm, 4.0 cm, and 4.2 cm. No evidence of dissection or intramural hematoma. No  abnormal mediastinal adenopathy No obvious acute pulmonary thromboembolism. No acute vertebral body compression deformity. Review of the MIP images confirms the above findings. IMPRESSION: No evidence of aortic dissection. Mild aneurysmal dilatation of the ascending aorta at 4.2 cm. Recommend annual imaging followup by CTA or MRA. This recommendation follows 2010 ACCF/AHA/AATS/ACR/ASA/SCA/SCAI/SIR/STS/SVM Guidelines for the Diagnosis and Management of Patients with Thoracic Aortic Disease. Circulation. 2010; 121: Z610-R604 Electronically Signed   By: Jolaine Click M.D.   On: 02/25/2015 13:23    Micro Results      No results found for this or any previous visit (from the past 240 hour(s)).  Today   Subjective    Fred Brown today has no headache,no chest abdominal pain,no new weakness tingling or numbness, feels much better wants to go home today.     Objective   Blood pressure 136/88, pulse 84, temperature 98.1 F (36.7 C), temperature source Oral, resp. rate 20, height 5\' 2"  (1.575 m), weight 69.9 kg (154 lb 1.6 oz), SpO2 100 %.   Intake/Output Summary (Last 24 hours) at 02/27/15 0959 Last data filed at 02/27/15 0900  Gross per 24 hour  Intake   1320  ml  Output      0 ml  Net   1320 ml    Exam Awake Alert, Oriented x 3, No new F.N deficits, Normal affect [place].AT,PERRAL Supple Neck,No JVD, No cervical lymphadenopathy appriciated.  Symmetrical Chest wall movement, Good air movement bilaterally, CTAB RRR,No Gallops,Rubs or new Murmurs, No Parasternal Heave +ve B.Sounds, Abd Soft, Non tender, No organomegaly appriciated, No rebound -guarding or rigidity. No Cyanosis, Clubbing or edema, No new Rash or bruise   Data Review   CBC w Diff: Lab Results  Component Value Date   WBC 8.5 02/25/2015   HGB 14.5 02/25/2015   HCT 42.5 02/25/2015   PLT 351 02/25/2015   LYMPHOPCT 30 07/15/2009   MONOPCT 5 07/15/2009   EOSPCT 7* 07/15/2009   BASOPCT 0 07/15/2009    CMP: Lab Results  Component Value Date   NA 139 02/27/2015   K 3.8 02/27/2015   CL 106 02/27/2015   CO2 24 02/27/2015   BUN 19 02/27/2015   CREATININE 0.94 02/27/2015   PROT 8.1 07/15/2009   ALBUMIN 4.4 07/15/2009   BILITOT 0.8 07/15/2009   ALKPHOS 90 07/15/2009   AST 24 07/15/2009   ALT 29 07/15/2009   Lab Results  Component Value Date   CHOL 227* 02/26/2015   HDL 44 02/26/2015   LDLCALC 161* 02/26/2015   TRIG 110 02/26/2015   CHOLHDL 5.2 02/26/2015   Lab Results  Component Value Date   HGBA1C 6.2* 02/26/2015     Total Time in preparing paper work, data evaluation and todays exam - 35 minutes  Susa Raring K M.D on 02/27/2015 at 9:59 AM  [facility]   Office  [phone]

## 2015-02-27 NOTE — Discharge Instructions (Signed)

## 2015-03-05 ENCOUNTER — Encounter: Payer: Self-pay | Admitting: Family Medicine

## 2015-03-05 ENCOUNTER — Ambulatory Visit: Payer: BLUE CROSS/BLUE SHIELD | Attending: Family Medicine | Admitting: Family Medicine

## 2015-03-05 VITALS — BP 156/79 | HR 80 | Temp 98.2°F | Resp 16 | Ht 64.25 in | Wt 160.0 lb

## 2015-03-05 DIAGNOSIS — M6289 Other specified disorders of muscle: Secondary | ICD-10-CM | POA: Diagnosis not present

## 2015-03-05 DIAGNOSIS — G458 Other transient cerebral ischemic attacks and related syndromes: Secondary | ICD-10-CM | POA: Diagnosis not present

## 2015-03-05 DIAGNOSIS — I10 Essential (primary) hypertension: Secondary | ICD-10-CM | POA: Insufficient documentation

## 2015-03-05 DIAGNOSIS — G459 Transient cerebral ischemic attack, unspecified: Secondary | ICD-10-CM | POA: Diagnosis not present

## 2015-03-05 DIAGNOSIS — R531 Weakness: Secondary | ICD-10-CM | POA: Insufficient documentation

## 2015-03-05 DIAGNOSIS — Z8673 Personal history of transient ischemic attack (TIA), and cerebral infarction without residual deficits: Secondary | ICD-10-CM

## 2015-03-05 DIAGNOSIS — I77810 Thoracic aortic ectasia: Secondary | ICD-10-CM

## 2015-03-05 DIAGNOSIS — Z79899 Other long term (current) drug therapy: Secondary | ICD-10-CM | POA: Diagnosis not present

## 2015-03-05 DIAGNOSIS — I712 Thoracic aortic aneurysm, without rupture: Secondary | ICD-10-CM | POA: Insufficient documentation

## 2015-03-05 DIAGNOSIS — Z0000 Encounter for general adult medical examination without abnormal findings: Secondary | ICD-10-CM

## 2015-03-05 DIAGNOSIS — Z7982 Long term (current) use of aspirin: Secondary | ICD-10-CM | POA: Diagnosis not present

## 2015-03-05 DIAGNOSIS — R7303 Prediabetes: Secondary | ICD-10-CM | POA: Diagnosis not present

## 2015-03-05 HISTORY — DX: Personal history of transient ischemic attack (TIA), and cerebral infarction without residual deficits: Z86.73

## 2015-03-05 MED ORDER — HYDROCHLOROTHIAZIDE 25 MG PO TABS: 25.0000 mg | ORAL_TABLET | Freq: Every day | ORAL | Status: DC

## 2015-03-05 NOTE — Progress Notes (Signed)
Subjective:  Patient ID: Fred Brown, male    DOB: Jan 09, 1966  Age: 50 y.o. MRN: [id number]  CC: Hypertension and Hospitalization Follow-up   HPI Fred Brown is a 50 year old man with a history of HTN hospitalized at [facility] from 02/25/15- 02/27/15 with a new diagnosis of TIA, ascending aortic aneurysm.  He had presented to the ED with chest pains, right sided numbness and tingling, Hypertensive urgency with a BP of 219/47. He was commenced on antihypertensives; troponins were negative, EKG unremarkable, MRI, MRA head revealed no acute intracranial abnormalities. CT Angio chest revealed mild aneurysmal dilatation of the ascending aorta of 4.2cm. 2d echo revealed EF of 60-65%, grade 1 diastolic dysfunction. He was seen by Neurology and placed on ASA and a statin; his symptoms were thought to be ore likely to his hypertensive urgency and less likely from a TIA. He was subsequently discharged to follow up with Cardiothoracic surgery   Today he complains of pain and mild weakness of his right arm and leg since his presentation to the hospital. Denies progression of symptoms, speech difficulties or problems with gait.  Outpatient Prescriptions Prior to Visit  Medication Sig Dispense Refill  . amLODipine (NORVASC) 10 MG tablet Take 1 tablet (10 mg total) by mouth daily. 30 tablet 0  . aspirin 325 MG tablet Take 1 tablet (325 mg total) by mouth daily. 30 tablet 0  . atorvastatin (LIPITOR) 20 MG tablet Take 1 tablet (20 mg total) by mouth daily at 6 PM. 30 tablet 0  . carvedilol (COREG) 6.25 MG tablet Take 1 tablet (6.25 mg total) by mouth 2 (two) times daily with a meal. 30 tablet 0  . hydrochlorothiazide (MICROZIDE) 12.5 MG capsule Take 1 capsule (12.5 mg total) by mouth daily. 30 capsule 0   No facility-administered medications prior to visit.    ROS Review of Systems  Constitutional: Negative for activity change and appetite change.  HENT: Negative for sinus pressure and sore throat.    Eyes: Negative for visual disturbance.  Respiratory: Negative for cough, chest tightness and shortness of breath.   Cardiovascular: Negative for chest pain and leg swelling.  Gastrointestinal: Negative for abdominal pain, diarrhea, constipation and abdominal distention.  Endocrine: Negative.   Genitourinary: Negative for dysuria.  Musculoskeletal:       See hpi  Skin: Negative for rash.  Allergic/Immunologic: Negative.   Neurological: Positive for weakness. Negative for light-headedness and numbness.  Psychiatric/Behavioral: Negative for suicidal ideas and dysphoric mood.    Objective:  BP 156/79 mmHg  Pulse 80  Temp(Src) 98.2 F (36.8 C) (Oral)  Resp 16  Ht 5' 4.25" (1.632 m)  Wt 160 lb (72.576 kg)  BMI 27.25 kg/m2  SpO2 98%  BP/Weight 03/05/2015 02/27/2015 02/25/2015  Systolic BP 156 136 -  Diastolic BP 79 88 -  Wt. (Lbs) 160 - 154.1  BMI 27.25 - 28.18    Dg Chest 2 View  02/25/2015  CLINICAL DATA:  Chest and left arm pain. EXAM: CHEST  2 VIEW COMPARISON:  None. FINDINGS: There is a torturous thoracic aorta with prominence of the ascending aorta. No pneumothorax. The heart, hila, and remainder of the mediastinum are normal. No pulmonary nodules, masses, or infiltrates. No other acute abnormalities. IMPRESSION: There is a tortuous and prominent ascending thoracic aorta. If there is concern for aortic pathology, a CTA of the chest could better evaluate. No other abnormalities. Electronically Signed   By: Gerome Sam III M.D   On: 02/25/2015 10:45  Ct Head Wo Contrast  02/25/2015  CLINICAL DATA:  Dizziness and right-sided weakness with inability to walk this morning. EXAM: CT HEAD WITHOUT CONTRAST TECHNIQUE: Contiguous axial images were obtained from the base of the skull through the vertex without intravenous contrast. COMPARISON:  None. FINDINGS: 1.3 cm triangular hypodensity peripherally in the left cerebellum image 8 series 2, suspicious for a remote infarct. There is  likely some volume averaging along its lateral margin. Along the posterior inferior margin of the left lentiform nucleus, a 7 by 6 mm hypodensity suspicious for a remote lacunar infarct is present, image 14 series 2. Otherwise, the brainstem, cerebellum, cerebral peduncles, thalami, basal ganglia, basilar cisterns, and ventricular system appear within normal limits. No intracranial hemorrhage, mass lesion, or acute CVA. Mild chronic ethmoid sinusitis observed. No middle ear fluid or mastoid effusion, although the mastoid air cells are aplastic on the left. IMPRESSION: 1. 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 Small suspected remote lacunar infarct in the left lentiform nucleus. Correlate with risk factors for cerebral vascular disease in determining need for further workup. I do not see any acute intracranial findings. 2. Mild chronic ethmoid sinusitis. Electronically Signed   By: Gaylyn Rong M.D.   On: 02/25/2015 11:15   Mr Fred Brown Wo Contrast  02/25/2015  CLINICAL DATA:  50 year old male with right side weakness and dizziness. Initial encounter. EXAM: MRI HEAD WITHOUT CONTRAST MRA HEAD WITHOUT CONTRAST TECHNIQUE: Multiplanar, multiecho pulse sequences of the brain and surrounding structures were obtained without intravenous contrast. Angiographic images of the head were obtained using MRA technique without contrast. COMPARISON:  Head CT 1056 hours today. FINDINGS: MRI HEAD FINDINGS No restricted diffusion to suggest acute infarction. No midline shift, mass effect, evidence of mass lesion, ventriculomegaly, extra-axial collection or acute intracranial hemorrhage. Cervicomedullary junction and pituitary are within normal limits. Negative visualized cervical spine. Major intracranial vascular flow voids are within normal limits. Small chronic infarct in the left cerebellum (series 10, image 15) with hemosiderin corresponding to the CT finding earlier today. T2 and FLAIR  heterogeneity in the bilateral basal ganglia does appear most likely due to multiple bilateral chronic lacunar infarcts (series 7, image 17). Occasional superimposed corona radiata chronic lacunar infarcts (series 7, image 20). No supratentorial cortical encephalomalacia. Scattered chronic micro hemorrhages in the brain including the left corona radiata (series 9, image 72). Grossly normal visualized internal auditory structures. Mastoids are clear. Small maxillary sinus mucous retention cyst. Orbit and scalp soft tissues are within normal limits. Normal bone marrow signal. MRA HEAD FINDINGS Antegrade flow in the posterior circulation with codominant distal vertebral arteries. No distal vertebral artery stenosis. Normal PICA origins. Incidental fenestrated basilar or artery origin (normal anatomic variation). Mild basilar artery irregularity without stenosis. SCA and PCA origins are within normal limits. Both posterior communicating arteries are present. Bilateral PCA branches are mildly irregular, without stenosis. Antegrade flow in both ICA siphons. Proximal cavernous ICA irregularity without stenosis. Ophthalmic and posterior communicating artery origins are normal. Patent carotid termini. MCA and ACA origins are within normal limits. Diminutive or absent anterior communicating artery. Visualized bilateral ACA branches are within normal limits. Right MCA M1 segment is within normal limits. There is mild to moderate regularity dominant right MCA M2 branch along its proximal segment. Right MCA bifurcation and other visualized right MCA branches otherwise normal. IMPRESSION: 1.  No acute intracranial abnormality. 2. 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 3. Intracranial MRA remarkable for age advanced intracranial atherosclerosis (including involvement of  the mid basilar artery, ICA siphons, right MCA and PCA branches) with no intracranial stenosis or major circle of Willis branch occlusion identified. Electronically Signed   By: Odessa Fleming M.D.   On: 02/25/2015 18:54   Mr Brain Wo Contrast  02/25/2015  CLINICAL DATA:  50 year old male with right side weakness and dizziness. Initial encounter. EXAM: MRI HEAD WITHOUT CONTRAST MRA HEAD WITHOUT CONTRAST TECHNIQUE: Multiplanar, multiecho pulse sequences of the brain and surrounding structures were obtained without intravenous contrast. Angiographic images of the head were obtained using MRA technique without contrast. COMPARISON:  Head CT 1056 hours today. FINDINGS: MRI HEAD FINDINGS No restricted diffusion to suggest acute infarction. No midline shift, mass effect, evidence of mass lesion, ventriculomegaly, extra-axial collection or acute intracranial hemorrhage. Cervicomedullary junction and pituitary are within normal limits. Negative visualized cervical spine. Major intracranial vascular flow voids are within normal limits. Small chronic infarct in the left cerebellum (series 10, image 15) with hemosiderin corresponding to the CT finding earlier today. T2 and FLAIR heterogeneity in the bilateral basal ganglia does appear most likely due to multiple bilateral chronic lacunar infarcts (series 7, image 17). Occasional superimposed corona radiata chronic lacunar infarcts (series 7, image 20). No supratentorial cortical encephalomalacia. Scattered chronic micro hemorrhages in the brain including the left corona radiata (series 9, image 72). Grossly normal visualized internal auditory structures. Mastoids are clear. Small maxillary sinus mucous retention cyst. Orbit and scalp soft tissues are within normal limits. Normal bone marrow signal. MRA HEAD FINDINGS Antegrade flow in the posterior circulation with codominant distal vertebral arteries. No distal vertebral artery stenosis. Normal PICA origins. Incidental fenestrated  basilar or artery origin (normal anatomic variation). Mild basilar artery irregularity without stenosis. SCA and PCA origins are within normal limits. Both posterior communicating arteries are present. Bilateral PCA branches are mildly irregular, without stenosis. Antegrade flow in both ICA siphons. Proximal cavernous ICA irregularity without stenosis. Ophthalmic and posterior communicating artery origins are normal. Patent carotid termini. MCA and ACA origins are within normal limits. Diminutive or absent anterior communicating artery. Visualized bilateral ACA branches are within normal limits. Right MCA M1 segment is within normal limits. There is mild to moderate regularity dominant right MCA M2 branch along its proximal segment. Right MCA bifurcation and other visualized right MCA branches otherwise normal. IMPRESSION: 1.  No acute intracranial abnormality. 2. 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 3. Intracranial MRA remarkable for age advanced intracranial atherosclerosis (including involvement of the mid basilar artery, ICA siphons, right MCA and PCA branches) with no intracranial stenosis or major circle of Willis branch occlusion identified. Electronically Signed   By: Odessa Fleming M.D.   On: 02/25/2015 18:54   Ct Angio Chest Aorta W/cm &/or Wo/cm  02/25/2015  CLINICAL DATA:  Palpitations. Right-sided weakness. Dizziness. Rule out dissection. EXAM: CT ANGIOGRAPHY CHEST WITH CONTRAST TECHNIQUE: Multidetector CT imaging of the chest was performed using the standard protocol during bolus administration of intravenous contrast. Multiplanar CT image reconstructions and MIPs were obtained to evaluate the vascular anatomy. CONTRAST:  OMNIPAQUE IOHEXOL 350 MG/ML SOLN COMPARISON:  07/16/2009 FINDINGS: Maximal aortic diameter at the sinus of Valsalva,  sino-tubular junction, and ascending aorta are 4.4 cm, 4.0 cm, and 4.2 cm. No evidence of dissection or intramural hematoma. No abnormal mediastinal adenopathy No  obvious acute pulmonary thromboembolism. No acute vertebral body compression deformity. Review of the MIP images confirms the above findings. IMPRESSION: No evidence of aortic dissection. Mild aneurysmal dilatation of the ascending aorta at 4.2 cm. Recommend annual imaging followup by CTA or MRA. This recommendation follows 2010 ACCF/AHA/AATS/ACR/ASA/SCA/SCAI/SIR/STS/SVM Guidelines for the Diagnosis and Management of Patients with Thoracic Aortic Disease. Circulation. 2010; 121: [id number] Electronically Signed   By: Jolaine Click M.D.   On: 02/25/2015 13:23    Physical Exam  Constitutional: He is oriented to person, place, and time. He appears well-developed and well-nourished.  HENT:  Head: Normocephalic and atraumatic.  Right Ear: External ear normal.  Left Ear: External ear normal.  Eyes: Conjunctivae and EOM are normal. Pupils are equal, round, and reactive to light.  Neck: Normal range of motion. Neck supple. No tracheal deviation present.  Cardiovascular: Normal rate, regular rhythm and normal heart sounds.   No murmur heard. Pulmonary/Chest: Effort normal and breath sounds normal. No respiratory distress. He has no wheezes. He exhibits no tenderness.  Abdominal: Soft. Bowel sounds are normal. He exhibits no mass. There is no tenderness.  Musculoskeletal: Normal range of motion. He exhibits no edema or tenderness.  Neurological: He is alert and oriented to person, place, and time.  Motor strength is 5/5 in all extremities  Skin: Skin is warm and dry.  Psychiatric: He has a normal mood and affect.     Assessment & Plan:   1. Pre Diabetes - POCT A1C- 6.2  2. Ascending aorta dilatation (HCC) Aortic aneurysm measuring 4.4 cm in diameter. He has an upcoming appointment with CT surgery next week  3. Acute right-sided  weakness Likely secondary to TIA. Physical exam demonstrates normal motor strength  4. Other specified transient cerebral ischemias Continue aspirin  5. Essential hypertension Uncontrolled and so I have reduced dose of hydrochlorothiazide. Basic metabolic panel at next visit to evaluate for hypokalemia - hydrochlorothiazide (HYDRODIURIL) 25 MG tablet; Take 1 tablet (25 mg total) by mouth daily.  Dispense: 30 tablet; Refill: 3  6. Health care maintenance - Flu Vaccine QUAD 36+ mos PF IM (Fluarix & Fluzone Quad PF)   Meds ordered this encounter  Medications  . hydrochlorothiazide (HYDRODIURIL) 25 MG tablet    Sig: Take 1 tablet (25 mg total) by mouth daily.    Dispense:  30 tablet    Refill:  3    Follow-up: Return in about 2 weeks (around 03/19/2015) for Follow-up on hypertension.   Jaclyn Shaggy MD

## 2015-03-05 NOTE — Progress Notes (Signed)
Patien'ts here for hospital f/up for levated blood presuure.   Patient states he's still having pain on right side of arm and leg.   Pain described as aching, and hard to elevate his arm. The feeling heaviness started today.    Patient requesting medication refills.

## 2015-03-05 NOTE — Patient Instructions (Signed)
Hypertension Hypertension, commonly called high blood pressure, is when the force of blood pumping through your arteries is too strong. Your arteries are the blood vessels that carry blood from your heart throughout your body. A blood pressure reading consists of a higher number over a lower number, such as 110/72. The higher number (systolic) is the pressure inside your arteries when your heart pumps. The lower number (diastolic) is the pressure inside your arteries when your heart relaxes. Ideally you want your blood pressure below 120/80. Hypertension forces your heart to work harder to pump blood. Your arteries may become narrow or stiff. Having untreated or uncontrolled hypertension can cause heart attack, stroke, kidney disease, and other problems. RISK FACTORS Some risk factors for high blood pressure are controllable. Others are not.  Risk factors you cannot control include:   Race. You may be at higher risk if you are African American.  Age. Risk increases with age.  Gender. Men are at higher risk than women before age 45 years. After age 65, women are at higher risk than men. Risk factors you can control include:  Not getting enough exercise or physical activity.  Being overweight.  Getting too much fat, sugar, calories, or salt in your diet.  Drinking too much alcohol. SIGNS AND SYMPTOMS Hypertension does not usually cause signs or symptoms. Extremely high blood pressure (hypertensive crisis) may cause headache, anxiety, shortness of breath, and nosebleed. DIAGNOSIS To check if you have hypertension, your health care provider will measure your blood pressure while you are seated, with your arm held at the level of your heart. It should be measured at least twice using the same arm. Certain conditions can cause a difference in blood pressure between your right and left arms. A blood pressure reading that is higher than normal on one occasion does not mean that you need treatment. If  it is not clear whether you have high blood pressure, you may be asked to return on a different day to have your blood pressure checked again. Or, you may be asked to monitor your blood pressure at home for 1 or more weeks. TREATMENT Treating high blood pressure includes making lifestyle changes and possibly taking medicine. Living a healthy lifestyle can help lower high blood pressure. You may need to change some of your habits. Lifestyle changes may include:  Following the DASH diet. This diet is high in fruits, vegetables, and whole grains. It is low in salt, red meat, and added sugars.  Keep your sodium intake below 2,300 mg per day.  Getting at least 30-45 minutes of aerobic exercise at least 4 times per week.  Losing weight if necessary.  Not smoking.  Limiting alcoholic beverages.  Learning ways to reduce stress. Your health care provider may prescribe medicine if lifestyle changes are not enough to get your blood pressure under control, and if one of the following is true:  You are 18-59 years of age and your systolic blood pressure is above 140.  You are 60 years of age or older, and your systolic blood pressure is above 150.  Your diastolic blood pressure is above 90.  You have diabetes, and your systolic blood pressure is over 140 or your diastolic blood pressure is over 90.  You have kidney disease and your blood pressure is above 140/90.  You have heart disease and your blood pressure is above 140/90. Your personal target blood pressure may vary depending on your medical conditions, your age, and other factors. HOME CARE INSTRUCTIONS    Have your blood pressure rechecked as directed by your health care provider.   Take medicines only as directed by your health care provider. Follow the directions carefully. Blood pressure medicines must be taken as prescribed. The medicine does not work as well when you skip doses. Skipping doses also puts you at risk for  problems.  Do not smoke.   Monitor your blood pressure at home as directed by your health care provider. SEEK MEDICAL CARE IF:   You think you are having a reaction to medicines taken.  You have recurrent headaches or feel dizzy.  You have swelling in your ankles.  You have trouble with your vision. SEEK IMMEDIATE MEDICAL CARE IF:  You develop a severe headache or confusion.  You have unusual weakness, numbness, or feel faint.  You have severe chest or abdominal pain.  You vomit repeatedly.  You have trouble breathing. MAKE SURE YOU:   Understand these instructions.  Will watch your condition.  Will get help right away if you are not doing well or get worse.   This information is not intended to replace advice given to you by your health care provider. Make sure you discuss any questions you have with your health care provider.   Document Released: 01/16/2005 Document Revised: 06/02/2014 Document Reviewed: 11/08/2012 Elsevier Interactive Patient Education 2016 [employer]  

## 2015-03-06 DIAGNOSIS — E119 Type 2 diabetes mellitus without complications: Secondary | ICD-10-CM

## 2015-03-06 HISTORY — DX: Type 2 diabetes mellitus without complications: E11.9

## 2015-03-15 ENCOUNTER — Other Ambulatory Visit: Payer: Self-pay | Admitting: Pharmacist

## 2015-03-15 ENCOUNTER — Telehealth: Payer: Self-pay | Admitting: Family Medicine

## 2015-03-15 MED ORDER — AMLODIPINE BESYLATE 10 MG PO TABS: 10.0000 mg | ORAL_TABLET | Freq: Every day | ORAL | Status: DC

## 2015-03-15 MED ORDER — ATORVASTATIN CALCIUM 20 MG PO TABS: 20.0000 mg | ORAL_TABLET | Freq: Every day | ORAL | Status: DC

## 2015-03-15 MED ORDER — CARVEDILOL 6.25 MG PO TABS: 6.2500 mg | ORAL_TABLET | Freq: Two times a day (BID) | ORAL | Status: DC

## 2015-03-15 NOTE — Telephone Encounter (Signed)
Refilled medications

## 2015-03-15 NOTE — Telephone Encounter (Signed)
Patient came in requesting a medication refill for carvedilol. Please follow up.

## 2015-03-22 ENCOUNTER — Ambulatory Visit: Payer: BLUE CROSS/BLUE SHIELD | Attending: Family Medicine | Admitting: Family Medicine

## 2015-03-22 ENCOUNTER — Encounter: Payer: Self-pay | Admitting: Family Medicine

## 2015-03-22 VITALS — BP 148/95 | HR 101 | Temp 98.3°F | Resp 15 | Ht 64.0 in | Wt 155.8 lb

## 2015-03-22 DIAGNOSIS — H538 Other visual disturbances: Secondary | ICD-10-CM

## 2015-03-22 DIAGNOSIS — Z7982 Long term (current) use of aspirin: Secondary | ICD-10-CM | POA: Diagnosis not present

## 2015-03-22 DIAGNOSIS — R7303 Prediabetes: Secondary | ICD-10-CM | POA: Diagnosis not present

## 2015-03-22 DIAGNOSIS — I10 Essential (primary) hypertension: Secondary | ICD-10-CM | POA: Insufficient documentation

## 2015-03-22 DIAGNOSIS — I77810 Thoracic aortic ectasia: Secondary | ICD-10-CM

## 2015-03-22 DIAGNOSIS — E785 Hyperlipidemia, unspecified: Secondary | ICD-10-CM | POA: Diagnosis not present

## 2015-03-22 DIAGNOSIS — R05 Cough: Secondary | ICD-10-CM | POA: Diagnosis not present

## 2015-03-22 DIAGNOSIS — I712 Thoracic aortic aneurysm, without rupture: Secondary | ICD-10-CM | POA: Insufficient documentation

## 2015-03-22 DIAGNOSIS — Z79899 Other long term (current) drug therapy: Secondary | ICD-10-CM | POA: Insufficient documentation

## 2015-03-22 DIAGNOSIS — R059 Cough, unspecified: Secondary | ICD-10-CM

## 2015-03-22 MED ORDER — ATORVASTATIN CALCIUM 20 MG PO TABS: 20.0000 mg | ORAL_TABLET | Freq: Every day | ORAL | Status: DC

## 2015-03-22 MED ORDER — AMLODIPINE BESYLATE 10 MG PO TABS: 10.0000 mg | ORAL_TABLET | Freq: Every day | ORAL | Status: DC

## 2015-03-22 MED ORDER — CETIRIZINE HCL 10 MG PO TABS: 10.0000 mg | ORAL_TABLET | Freq: Every day | ORAL | Status: DC

## 2015-03-22 NOTE — Progress Notes (Signed)
Patient has taken his medicine today but does not know the names of what he takes He complains of blurry vision  He complains of cough at night

## 2015-03-22 NOTE — Progress Notes (Signed)
Subjective:  Patient ID: Fred Brown, male    DOB: [birth year]  Age: 50 y.o. MRN: [id number]  CC: Follow-up   HPI Fred Brown presents for is a 50 year old man with a history of HTN hospitalized at [facility] in 01/2015 with a new diagnosis of TIA, ascending aortic aneurysm. He had presented to the ED with chest pains, right sided numbness and tingling, Hypertensive urgency with a BP of 219/47. He was commenced on antihypertensives;  unremarkable, MRI, MRA head revealed no acute intracranial abnormalities. CT Angio chest revealed mild aneurysmal dilatation of the ascending aorta of 4.2cm. 2d echo revealed EF of 60-65%, grade 1 diastolic dysfunction. He was seen by Neurology and placed on ASA and a statin; his symptoms were thought to be more likely to his hypertensive urgency and less likely from a TIA. He was subsequently discharged to follow up with Cardiothoracic surgery  Interval history: He never made an appointment to see the cardiothoracic surgeon even though we have discussed this at his last visit and gave the impression he had an upcoming appointment a week from his last visit; he states he was unaware of the diagnosis of the ascending aortic aneurysm. Today he complains of cough which is worse at night with associated postnasal drip and denies sinus tenderness or pressure, he has no headaches or nasal symptoms. He also complains of blurry vision and is prediabetic with A1c of 6.2   Outpatient Prescriptions Prior to Visit  Medication Sig Dispense Refill  . aspirin 325 MG tablet Take 1 tablet (325 mg total) by mouth daily. 30 tablet 0  . carvedilol (COREG) 6.25 MG tablet Take 1 tablet (6.25 mg total) by mouth 2 (two) times daily with a meal. 60 tablet 2  . hydrochlorothiazide (HYDRODIURIL) 25 MG tablet Take 1 tablet (25 mg total) by mouth daily. 30 tablet 3  . amLODipine (NORVASC) 10 MG tablet Take 1 tablet (10 mg total) by mouth daily. 30 tablet 2  . atorvastatin (LIPITOR) 20 MG  tablet Take 1 tablet (20 mg total) by mouth daily at 6 PM. 30 tablet 2   No facility-administered medications prior to visit.    ROS Review of Systems  Constitutional: Negative for activity change and appetite change.  HENT: Positive for postnasal drip. Negative for sinus pressure and sore throat.   Eyes: Positive for visual disturbance.  Respiratory: Positive for cough. Negative for chest tightness and shortness of breath.   Cardiovascular: Negative for chest pain and leg swelling.  Gastrointestinal: Negative for abdominal pain, diarrhea, constipation and abdominal distention.  Endocrine: Negative.   Genitourinary: Negative for dysuria.  Musculoskeletal: Negative for myalgias and joint swelling.  Skin: Negative for rash.  Allergic/Immunologic: Negative.   Neurological: Negative for weakness, light-headedness and numbness.  Psychiatric/Behavioral: Negative for suicidal ideas and dysphoric mood.    Objective:  BP 148/95 mmHg  Pulse 101  Temp(Src) 98.3 F (36.8 C)  Resp 15  Ht  (1.626 m)  Wt 155 lb 12.8 oz (70.67 kg)  BMI 26.73 kg/m2  SpO2 98%  BP/Weight 03/22/2015 03/05/2015 02/27/2015  Systolic BP 148 156 136  Diastolic BP 95 79 88  Wt. (Lbs) 155.8 160 -  BMI 26.73 27.25 -      Physical Exam Constitutional: He is oriented to person, place, and time. He appears well-developed and well-nourished.  Eyes: Conjunctivae and EOM are normal. Pupils are equal, round, and reactive to light.  Neck: Normal range of motion. Neck supple. No tracheal deviation present.  Cardiovascular: tachycardic rate, regular rhythm and normal heart sounds.   No murmur heard. Pulmonary/Chest: Effort normal and breath sounds normal. No respiratory distress. He has no wheezes. He exhibits no tenderness.  Abdominal: Soft. Bowel sounds are normal. He exhibits no mass. There is no tenderness.  Musculoskeletal: Normal range of motion. He exhibits no edema or tenderness.  Neurological: He is alert and  oriented to person, place, and time.  Motor strength is 5/5 in all extremities  Skin: Skin is warm and dry.  Psychiatric: He has a normal mood and affect.   Assessment & Plan:   1. Essential hypertension BP slightly above goal of less than 140/90. No regimen changes. Low-sodium,- - amLODipine (NORVASC) 10 MG tablet; Take 1 tablet (10 mg total) by mouth daily.  Dispense: 30 tablet; Refill: 2  2. Ascending aorta dilatation (HCC) - Ambulatory referral to Cardiothoracic Surgery  3. Prediabetes A1c of 6.2 Discussed lifestyle modifications to prevent progression to diabetes  4. Cough Likely secondary to postnasal drip. Will place on antihistamine - cetirizine (ZYRTEC) 10 MG tablet; Take 1 tablet (10 mg total) by mouth daily.  Dispense: 30 tablet; Refill: 2  5. Hyperlipidemia Uncontrolled with LDL of 161 from last month Continue low-cholesterol diet, lifestyle changes - atorvastatin (LIPITOR) 20 MG tablet; Take 1 tablet (20 mg total) by mouth daily at 6 PM.  Dispense: 30 tablet; Refill: 2  6. Blurry vision, bilateral Advised to seek care with an optometrist or ophthalmologist. He does not need a referral   Meds ordered this encounter  Medications  . amLODipine (NORVASC) 10 MG tablet    Sig: Take 1 tablet (10 mg total) by mouth daily.    Dispense:  30 tablet    Refill:  2  . atorvastatin (LIPITOR) 20 MG tablet    Sig: Take 1 tablet (20 mg total) by mouth daily at 6 PM.    Dispense:  30 tablet    Refill:  2  . cetirizine (ZYRTEC) 10 MG tablet    Sig: Take 1 tablet (10 mg total) by mouth daily.    Dispense:  30 tablet    Refill:  2    Follow-up: Return in about 3 months (around 06/19/2015) for follow up on Diabetes mellitus.   Jaclyn Shaggy MD

## 2015-03-30 ENCOUNTER — Encounter: Payer: Self-pay | Admitting: Thoracic Surgery (Cardiothoracic Vascular Surgery)

## 2015-03-30 ENCOUNTER — Institutional Professional Consult (permissible substitution) (INDEPENDENT_AMBULATORY_CARE_PROVIDER_SITE_OTHER): Payer: BLUE CROSS/BLUE SHIELD | Admitting: Thoracic Surgery (Cardiothoracic Vascular Surgery)

## 2015-03-30 VITALS — BP 141/93 | HR 89 | Resp 16 | Ht 64.0 in | Wt 155.0 lb

## 2015-03-30 DIAGNOSIS — I712 Thoracic aortic aneurysm, without rupture, unspecified: Secondary | ICD-10-CM

## 2015-03-30 NOTE — Progress Notes (Signed)
PCP is Jaclyn Shaggy, MD Referring Provider is Jaclyn Shaggy, MD  Chief Complaint  Patient presents with  . Thoracic Aortic Aneurysm    per CTA CHEST 02/25/15    HPI: Fred Brown is sent for consultation regarding an ascending aneurysm. He is accompanied by his wife and a professional interpreter, Mr. Cameron Proud, who speaks [place].  Mr. Brining is a 50 year old [place] gentleman who presented to [facility] on 02/25/2015 with a chief complaint of numbness and weakness in his right arm and leg. He also complained of blurred vision and dizziness. The symptoms lasted about 15 minutes. He was admitted with a hypertensive crisis (systolic blood pressure 219) and transient ischemic attack. A head CT showed old infarcts. His blood pressure was controlled and he was discharged without complication. While in the hospital he had an echocardiogram which showed normal left ventricular function with mild aortic regurgitation. There was no PFO. A CT angiogram of the chest was also done which showed a 1.2 cm ascending aortic aneurysm without evidence of rupture or dissection.  He has not had any recurrence of these symptoms since discharge  Past medical history  Hypertension Hyperlipidemia Prediabetes Transient ischemic attack History of remote CVA previously undiagnosed Hypokalemia Medical noncompliance  Past surgical history  None  Family history  Patient uncertain as both parents died young  Social History Social History  Substance Use Topics  . Smoking status: Never Smoker   . Smokeless tobacco: None  . Alcohol Use: No    Current Outpatient Prescriptions  Medication Sig Dispense Refill  . amLODipine (NORVASC) 10 MG tablet Take 1 tablet (10 mg total) by mouth daily. 30 tablet 2  . aspirin 325 MG tablet Take 1 tablet (325 mg total) by mouth daily. 30 tablet 0  . atorvastatin (LIPITOR) 20 MG tablet Take 1 tablet (20 mg total) by mouth daily at 6 PM. 30 tablet 2  . carvedilol (COREG) 6.25 MG  tablet Take 1 tablet (6.25 mg total) by mouth 2 (two) times daily with a meal. 60 tablet 2  . cetirizine (ZYRTEC) 10 MG tablet Take 1 tablet (10 mg total) by mouth daily. 30 tablet 2  . hydrochlorothiazide (MICROZIDE) 12.5 MG capsule Take 12.5 mg by mouth daily.  0   No current facility-administered medications for this visit.    No Known Allergies  Review of Systems  Constitutional: Negative for fever, chills, activity change and unexpected weight change.  HENT: Positive for dental problem. Negative for trouble swallowing and voice change.   Eyes: Positive for visual disturbance (blurred vision resolved).  Respiratory: Positive for cough. Negative for shortness of breath.   Cardiovascular: Positive for chest pain.  Gastrointestinal: Negative for abdominal pain and blood in stool.  Genitourinary: Negative for hematuria and difficulty urinating.  Musculoskeletal: Negative for myalgias and arthralgias.  Neurological: Positive for dizziness, weakness (Right side resolved) and numbness (right sided, resolved). Negative for syncope.  Hematological: Negative for adenopathy. Does not bruise/bleed easily.  All other systems reviewed and are negative.   BP 141/93 mmHg  Pulse 89  Resp 16  Ht  (1.626 m)  Wt 155 lb (70.308 kg)  BMI 26.59 kg/m2  SpO2 96% Physical Exam  Constitutional: He is oriented to person, place, and time. He appears well-developed and well-nourished. No distress.  HENT:  Head: Normocephalic and atraumatic.  Mouth/Throat: No oropharyngeal exudate.  Poor dentition  Eyes: Conjunctivae and EOM are normal. Pupils are equal, round, and reactive to light. No scleral icterus.  Neck: Normal range  of motion. Neck supple. No thyromegaly present.  Cardiovascular: Normal rate, regular rhythm and intact distal pulses.  Exam reveals no gallop and no friction rub.   Murmur (2/6 systolic) heard. Pulmonary/Chest: Effort normal and breath sounds normal. No respiratory distress. He  has no wheezes. He has no rales.  Abdominal: Soft. He exhibits no distension. There is no tenderness.  Musculoskeletal: Normal range of motion. He exhibits no edema or tenderness.  Lymphadenopathy:    He has no cervical adenopathy.  Neurological: He is alert and oriented to person, place, and time. No cranial nerve deficit.  No motor deficit  Skin: Skin is warm and dry.  Vitals reviewed.    Diagnostic Tests: CT ANGIOGRAPHY CHEST WITH CONTRAST  TECHNIQUE: Multidetector CT imaging of the chest was performed using the standard protocol during bolus administration of intravenous contrast. Multiplanar CT image reconstructions and MIPs were obtained to evaluate the vascular anatomy.  CONTRAST: OMNIPAQUE IOHEXOL 350 MG/ML SOLN  COMPARISON: 07/16/2009  FINDINGS: Maximal aortic diameter at the sinus of Valsalva, sino-tubular junction, and ascending aorta are 4.4 cm, 4.0 cm, and 4.2 cm. No evidence of dissection or intramural hematoma.  No abnormal mediastinal adenopathy  No obvious acute pulmonary thromboembolism.  No acute vertebral body compression deformity.  Review of the MIP images confirms the above findings.  IMPRESSION: No evidence of aortic dissection.  Mild aneurysmal dilatation of the ascending aorta at 4.2 cm. Recommend annual imaging followup by CTA or MRA. This recommendation follows 2010 ACCF/AHA/AATS/ACR/ASA/SCA/SCAI/SIR/STS/SVM Guidelines for the Diagnosis and Management of Patients with Thoracic Aortic Disease. Circulation. 2010; 121: Z610-R604   Electronically Signed  By: Jolaine Click M.D.  On: 02/25/2015 13:23  Echocardiogram 02/26/2015 Study Conclusions  - Left ventricle: The cavity size was normal. There was mild concentric hypertrophy. Systolic function was normal. The estimated ejection fraction was in the range of 60% to 65%. Wall motion was normal; there were no regional wall motion abnormalities. Doppler  parameters are consistent with abnormal left ventricular relaxation (grade 1 diastolic dysfunction). - Aortic valve: There was mild regurgitation. - Left atrium: The atrium was mildly dilated. - Right atrium: The atrium was mildly dilated. - Atrial septum: No defect or patent foramen ovale was identified. Echo contrast study showed no right-to-left atrial level shunt, at baseline or with provocation.  I reviewed the CT angiogram and echocardiogram images and concur with the findings as noted above  Impression: Fred Brown is a 50 year old gentleman with a history of hypertension and hyperlipidemia who recently had a hypertensive crisis and transient ischemic attack. During his evaluation he was found to have a 4.2 cm ascending aortic aneurysm. His aorta measured 4.4 cm and the sinuses of Valsalva and 4 cm at the sinotubular junction. Echocardiogram showed normal left ventricular function and mild aortic insufficiency.  I had a long discussion with Mr. Vickrey via the interpreter. I showed him the CT angiogram. We discussed the implications of the finding of the ascending aortic aneurysm. There is no indication for surgery at the present time. Typically 5.5 cm is the size cut off for surgery for aneurysms in this area.  He had a hypertensive crisis. I emphasized the importance of adherence with blood pressure medication and strict blood pressure control to prevent complications of the aneurysm such as rupture or dissection and to limit the wall stress on the aneurysm to prevent increase in size over time. I think he understands the importance of blood pressure control.  I emphasized the importance of watching his sodium intake  in terms of helping manage his blood pressure.  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 He does not have any residual neurologic defect.  Again there is no indication for surgery at this time but  he will need follow-up of his ascending aneurysm essentially for life. I recommended he return in 1 year with a repeat CT angiogram.  Plan: Return in 1 year with CT angiogram of chest  I spent 45 minutes with Mr. Talcott during this visit, greater than 50% of the time was spent in counseling.  Loreli Slot, MD [facility] [phone]

## 2015-03-31 ENCOUNTER — Other Ambulatory Visit: Payer: Self-pay | Admitting: *Deleted

## 2015-06-14 ENCOUNTER — Telehealth: Payer: Self-pay | Admitting: Family Medicine

## 2015-06-14 MED ORDER — CARVEDILOL 6.25 MG PO TABS: 6.2500 mg | ORAL_TABLET | Freq: Two times a day (BID) | ORAL | Status: DC

## 2015-06-14 NOTE — Telephone Encounter (Signed)
Patient's wife came in requesting a medication refill for Carvedilol. Please follow up.

## 2015-06-14 NOTE — Telephone Encounter (Signed)
Medication refilled

## 2015-06-21 ENCOUNTER — Ambulatory Visit: Payer: BLUE CROSS/BLUE SHIELD | Attending: Family Medicine | Admitting: Family Medicine

## 2015-06-21 ENCOUNTER — Encounter: Payer: Self-pay | Admitting: Family Medicine

## 2015-06-21 VITALS — BP 142/60 | HR 83 | Temp 98.1°F | Resp 16 | Ht 64.0 in | Wt 156.8 lb

## 2015-06-21 DIAGNOSIS — E785 Hyperlipidemia, unspecified: Secondary | ICD-10-CM

## 2015-06-21 DIAGNOSIS — R7303 Prediabetes: Secondary | ICD-10-CM | POA: Diagnosis not present

## 2015-06-21 DIAGNOSIS — I77810 Thoracic aortic ectasia: Secondary | ICD-10-CM

## 2015-06-21 DIAGNOSIS — I77819 Aortic ectasia, unspecified site: Secondary | ICD-10-CM | POA: Diagnosis not present

## 2015-06-21 DIAGNOSIS — Z7982 Long term (current) use of aspirin: Secondary | ICD-10-CM | POA: Insufficient documentation

## 2015-06-21 DIAGNOSIS — Z8673 Personal history of transient ischemic attack (TIA), and cerebral infarction without residual deficits: Secondary | ICD-10-CM | POA: Insufficient documentation

## 2015-06-21 DIAGNOSIS — I10 Essential (primary) hypertension: Secondary | ICD-10-CM

## 2015-06-21 DIAGNOSIS — Z79899 Other long term (current) drug therapy: Secondary | ICD-10-CM | POA: Insufficient documentation

## 2015-06-21 LAB — GLUCOSE, POCT (MANUAL RESULT ENTRY): POC Glucose: 110 mg/dl — AB (ref 70–99)

## 2015-06-21 MED ORDER — AMLODIPINE BESYLATE 10 MG PO TABS: 10.0000 mg | ORAL_TABLET | Freq: Every day | ORAL | Status: DC

## 2015-06-21 MED ORDER — ASPIRIN 325 MG PO TABS: 325.0000 mg | ORAL_TABLET | Freq: Every day | ORAL | Status: AC

## 2015-06-21 MED ORDER — CARVEDILOL 6.25 MG PO TABS: 6.2500 mg | ORAL_TABLET | Freq: Two times a day (BID) | ORAL | Status: DC

## 2015-06-21 MED ORDER — ATORVASTATIN CALCIUM 20 MG PO TABS: 20.0000 mg | ORAL_TABLET | Freq: Every day | ORAL | Status: DC

## 2015-06-21 NOTE — Progress Notes (Signed)
Patient's here for f/up pre DM/HTN.  Patient states he's been feeling fatigue at the end of the day which he says is related to work, but reports feeling fine today.  Patient reports taking his BP meds this morning.  Patient requesting refill on meds.

## 2015-06-21 NOTE — Addendum Note (Signed)
Addended by: Benjamin StainBENNETT-CURSE, Raya Mckinstry L on: 06/21/2015 04:20 PM   Modules accepted: Orders, SmartSet

## 2015-06-21 NOTE — Patient Instructions (Signed)
T?ng huy?t áp °(Hypertension) °T?ng huy?t áp, th??ng ???c g?i là huy?t áp cao, là khi l?c b?m máu qua ??ng m?ch c?a quý v? quá m?nh. ??ng m?ch c?a quý v? là các m?ch máu mang máu t? tim ?i kh?p c? th? c?a quý v?. K?t qu? ?o huy?t áp có m?t con s? cao và m?t con s? th?p, ch?ng h?n nh? 110/72. Con s? cao (tâm thu) là áp l?c bên trong ??ng m?ch khi tim quý v? b?m. Con s? th?p (tâm tr??ng) là áp l?c bên trong ??ng m?ch khi tim quý v? giãn ra. Huy?t áp lý t??ng c?n cho quý v? ph?i d??i 120/80. °Ch?ng t?ng huy?t áp bu?c tim quý v? ph?i làm vi?c v?t v? h?n ?? b?m máu. ??ng m?ch c?a quý v? có th? b? h?p ho?c c?ng. Huy?t áp cao không ???c ?i?u tr? ho?c không ???c ki?m soát có th? d?n t?i nh?i máu c? tim, ??t qu?, b?nh th?n và nh?ng v?n ?? khác. °CÁC Y?U T? NGUY C? °M?t s? y?u t? nguy c? d?n ??n huy?t áp cao có th? ki?m soát ???c. M?t s? y?u t? khác thì không.  °Nh?ng y?u t? nguy c? không th? ki?m soát ???c bao g?m:  °· Ch?ng t?c. Quý v? có nguy c? cao h?n n?u quý v? là ng??i M? g?c Phi. °· ?? tu?i. Nguy c? t?ng lên theo ?? tu?i. °· Gi?i tính. Nam gi?i có nguy c? cao h?n ph? n? tr??c tu?i 45. Sau tu?i 65, ph? n? có nguy c? cao h?n nam gi?i. °Nh?ng y?u t? nguy c? có th? ki?m soát ???c bao g?m: °· Không t?p th? d?c ho?c các ho?t ??ng th? ch?t ??y ??. °· Th?a cân. °· ?n quá nhi?u ch?t béo, ???ng, ca-lo, ho?c mu?i. °· U?ng quá nhi?u r??u. °D?U HI?U VÀ TRI?U CH?NG °T?ng huy?t áp th??ng không gây ra d?u hi?u ho?c tri?u ch?ng. Huy?t áp r?t cao (c?n cao huy?t áp) có th? gây ?au ??u, lo l?ng, khó th? và ch?y máu cam. °CH?N ?OÁN °?? ki?m tra xem quý v? có t?ng huy?t áp không, chuyên gia ch?m sóc s?c kh?e c?a quý v? s? ?o huy?t áp trong khi quý v? ng?i ??t tay ? m?c ngang v?i tim. Huy?t áp c?n ???c ?o ít nh?t hai l?n trên cùng m?t cánh tay. M?t s? tình tr?ng nh?t ??nh có th? làm cho huy?t áp khác nhau gi?a tay ph?i và tay trái c?a quý v?. K?t qu? ?o huy?t áp cao h?n bình th??ng ? m?t th?i ?i?m nào ?ó không có ngh?a là quý v? c?n ?i?u  tr?. N?u không rõ li?u quý v? có huy?t áp cao hay không, quý v? có th? ???c ?? ngh? tr? l?i vào m?t ngày khác ?? ki?m tra l?i huy?t áp. Ho?c quý v? có th? ???c yêu c?u theo dõi huy?t áp ? nhà trong 1 tu?n ho?c h?n. °?I?U TR? °?i?u tr? huy?t áp cao gao g?m thay ??i l?i s?ng và có th? ph?i dùng thu?c. Có m?t l?i s?ng lành m?nh có th? giúp làm gi?m huy?t áp cao. Quý v? có th? c?n thay ??i m?t s? thói quen. °Thay ??i l?i s?ng có th? bao g?m: °· Th?c hi?n ch? ?? ?n DASH. Ch? ?? ?n này có nhi?u trái cây, rau và ng? c?c nguyên h?t. Có ít mu?i, th?t ??, và ít b? sung ???ng. °· Duy trì l??ng mu?i tiêu th? d??i 2.300 mg m?i ngày. °· T?p aerobic ít nh?t 30-45 phút ít   nh?t 4 l?n m?i tu?n. °· Gi?m cân n?u c?n thi?t. °· Không hút thu?c. °· H?n ch? ?? u?ng có c?n. °· H?c các cách gi?m c?ng th?ng. °Chuyên gia ch?m sóc s?c kh?e có th? kê ??n thu?c n?u thay ??i l?i s?ng không ?? ?? ??a huy?t áp v? m?c có th? ki?m soát ???c và n?u m?t trong nh?ng ?i?u sau là ?úng: °· Quý v? t? 18-59 tu?i và huy?t áp tâm thu c?a quý v? trên 140. °· Quý v? t? 60 tu?i tr? lên và huy?t áp tâm thu c?a quý v? trên 150. °· Huy?t áp tâm tr??ng c?a quý v? trên 90. °· Quý v? b? ti?u ???ng và huy?t áp tâm thu c?a quý v? trên 140 ho?c huy?t áp tâm tr??ng c?a quý v? trên 90. °· Quý v? b? b?nh th?n và huy?t áp quý v? trên 140/90. °· Quý v? b? b?nh tim và huy?t áp quý v? trên 140/90. °Huy?t áp m?c tiêu cá nhân c?a quý v? có th? khác nhau tùy thu?c và tình tr?ng b?nh lý, tu?i và các nhân t? khác. °H??NG D?N CH?M SÓC T?I NHÀ °· Ki?m tra l?i huy?t áp c?a quý v? theo ch? d?n c?a chuyên gia ch?m sóc s?c kh?e. °· Ch? s? d?ng thu?c theo ch? d?n c?a chuyên gia ch?m sóc s?c kh?e. Làm theo ch? d?n m?t cách c?n th?n. Thu?c ?i?u tr? huy?t áp ph?i ???c dùng theo ??n ?ã kê. Thu?c c?ng s? không có tác d?ng khi quý v? b? li?u. Vi?c b? li?u thu?c c?ng làm quý v? có nguy c? phát sinh v?n ??. °· Không hút thu?c. °· Theo dõi huy?t áp c?a quý v? ? nhà theo ch? d?n c?a chuyên gia ch?m  sóc s?c kh?e. °?I KHÁM N?U:  °· Quý v? ngh? quý v? có ph?n ?ng v?i thu?c ?ang dùng. °· Quý v? b? ?au ??u ho?c c?m th?y chóng m?t tái di?n. °· Quý v? b? s?ng phù ? m?t cá chân. °· Quý v? có v?n ?? v? th? l?c. °NGAY L?P T?C ?I KHÁM N?U: °· Quý v? b? ?au ??u n?ng ho?c lú l?n. °· Quý v? b? y?u b?t th??ng, tê bì, ho?c c?m th?y nh? ng?t x?u. °· Quý v? b? ?au ng?c ho?c ?au b?ng r?t nhi?u. °· Quý v? nôn nhi?u l?n. °· Quý v? b? khó th?. °??M B?O QUÝ V?:  °· Hi?u rõ các h??ng d?n này. °· S? theo dõi tình tr?ng c?a mình. °· S? yêu c?u tr? giúp ngay l?p t?c n?u quý v? c?m th?y không kh?e ho?c th?y tr?m tr?ng h?n. °  °Thông tin này không nh?m m?c ?ích thay th? cho l?i khuyên mà chuyên gia ch?m sóc s?c kh?e nói v?i quý v?. Hãy b?o ??m quý v? ph?i th?o lu?n b?t k? v?n ?? gì mà quý v? có v?i chuyên gia ch?m sóc s?c kh?e c?a quý v?. °  °Document Released: 01/16/2005 Document Revised: 10/07/2014 °[employer] Interactive Patient Education ©2016 [employer] ° °

## 2015-06-21 NOTE — Progress Notes (Signed)
Subjective:  Patient ID: Fred Brown, male    DOB: 08/27/1965  Age: 50 y.o. MRN: [id number]  CC: Hypertension   HPI Fred Brown is a 50 year old man with a history of HTN, hyperlipidemia, prediabetes, TIA, ascending aortic aneurysm who comes into the clinic for follow-up visit.  He reports compliance with medications, low-sodium diet and exercise and has not been out of any of his medications. He was seen by cardiothoracic surgery 3 months ago and no surgical recommendations made at this time for his ascending aortic dilatation but he is scheduled for 1 year follow-up.  Denies chest pain, shortness of breath, weakness in any extremity, numbness. He has no complaints at this time. Blood sugar in the clinic is 110.  Outpatient Prescriptions Prior to Visit  Medication Sig Dispense Refill  . cetirizine (ZYRTEC) 10 MG tablet Take 1 tablet (10 mg total) by mouth daily. 30 tablet 2  . amLODipine (NORVASC) 10 MG tablet Take 1 tablet (10 mg total) by mouth daily. 30 tablet 2  . aspirin 325 MG tablet Take 1 tablet (325 mg total) by mouth daily. 30 tablet 0  . atorvastatin (LIPITOR) 20 MG tablet Take 1 tablet (20 mg total) by mouth daily at 6 PM. 30 tablet 2  . carvedilol (COREG) 6.25 MG tablet Take 1 tablet (6.25 mg total) by mouth 2 (two) times daily with a meal. 60 tablet 2  . hydrochlorothiazide (MICROZIDE) 12.5 MG capsule Take 12.5 mg by mouth daily.  0   No facility-administered medications prior to visit.    ROS Review of Systems  Constitutional: Negative for activity change and appetite change.  HENT: Negative for sinus pressure and sore throat.   Eyes: Negative for visual disturbance.  Respiratory: Negative for cough, chest tightness and shortness of breath.   Cardiovascular: Negative for chest pain and leg swelling.  Gastrointestinal: Negative for abdominal pain, diarrhea, constipation and abdominal distention.  Endocrine: Negative.   Genitourinary: Negative for dysuria.    Musculoskeletal: Negative for myalgias and joint swelling.  Skin: Negative for rash.  Allergic/Immunologic: Negative.   Neurological: Negative for weakness, light-headedness and numbness.  Psychiatric/Behavioral: Negative for suicidal ideas and dysphoric mood.    Objective:  BP 136/88 mmHg  Pulse 83  Temp(Src) 98.1 F (36.7 C) (Oral)  Resp 16  Ht 5\' 4"  (1.626 m)  Wt 156 lb 12.8 oz (71.124 kg)  BMI 26.90 kg/m2  SpO2 100%  BP/Weight 06/21/2015 03/30/2015 03/22/2015  Systolic BP 136 141 148  Diastolic BP 88 93 95  Wt. (Lbs) 156.8 155 155.8  BMI 26.9 26.59 26.73      Physical Exam  Constitutional: He is oriented to person, place, and time. He appears well-developed and well-nourished.  Cardiovascular: Normal rate, normal heart sounds and intact distal pulses.   No murmur heard. Pulmonary/Chest: Effort normal and breath sounds normal. He has no wheezes. He has no rales. He exhibits no tenderness.  Abdominal: Soft. Bowel sounds are normal. He exhibits no distension and no mass. There is no tenderness.  Musculoskeletal: Normal range of motion.  Neurological: He is alert and oriented to person, place, and time.   CMP Latest Ref Rng 02/27/2015 02/25/2015 07/15/2009  Glucose 65 - 99 mg/dL 409(W121(H) 119(J116(H) 478(G113(H)  BUN 6 - 20 mg/dL 19 14 12   Creatinine 0.61 - 1.24 mg/dL 9.560.94 2.130.87 1.0  Sodium 086135 - 145 mmol/L 139 139 142  Potassium 3.5 - 5.1 mmol/L 3.8 3.3(L) 3.3(L)  Chloride 101 - 111 mmol/L 106 105 110  CO2 22 - 32 mmol/L 24 22 -  Calcium 8.9 - 10.3 mg/dL 9.1 9.0 -  Total Protein 6.0 - 8.3 g/dL - - -  Total Bilirubin 0.3 - 1.2 mg/dL - - -  Alkaline Phos 39 - 117 U/L - - -  AST 0 - 37 U/L - - -  ALT 0 - 53 U/L - - -     Lipid Panel     Component Value Date/Time   CHOL 227* 02/26/2015 1145   TRIG 110 02/26/2015 1145   HDL 44 02/26/2015 1145   CHOLHDL 5.2 02/26/2015 1145   VLDL 22 02/26/2015 1145   LDLCALC 161* 02/26/2015 1145      Assessment & Plan:   1. Essential  hypertension Initial blood pressure was elevated but repeat performed manually was normal Low-sodium diet - COMPLETE METABOLIC PANEL WITH GFR; Future - amLODipine (NORVASC) 10 MG tablet; Take 1 tablet (10 mg total) by mouth daily.  Dispense: 30 tablet; Refill: 3 - carvedilol (COREG) 6.25 MG tablet; Take 1 tablet (6.25 mg total) by mouth 2 (two) times daily with a meal.  Dispense: 60 tablet; Refill: 3  2. Ascending aorta dilatation (HCC) Current dilatation and measures 4.2 cm in diameter and cut of the surgeries 5.5 cm Risk factor modification Monitored by Cardiothoracic surgery  3. Hyperlipidemia Uncontrolled Low cholesterol diet - Lipid panel; Future - atorvastatin (LIPITOR) 20 MG tablet; Take 1 tablet (20 mg total) by mouth daily at 6 PM.  Dispense: 30 tablet; Refill: 3  4. Prediabetes Last A1c was 6.2, 3 months ago - Hemoglobin A1c; Future  5. History of TIA (transient ischemic attack) Asymptomatic - aspirin 325 MG tablet; Take 1 tablet (325 mg total) by mouth daily.  Dispense: 30 tablet; Refill: 3   Meds ordered this encounter  Medications  . amLODipine (NORVASC) 10 MG tablet    Sig: Take 1 tablet (10 mg total) by mouth daily.    Dispense:  30 tablet    Refill:  3  . atorvastatin (LIPITOR) 20 MG tablet    Sig: Take 1 tablet (20 mg total) by mouth daily at 6 PM.    Dispense:  30 tablet    Refill:  3  . aspirin 325 MG tablet    Sig: Take 1 tablet (325 mg total) by mouth daily.    Dispense:  30 tablet    Refill:  3  . carvedilol (COREG) 6.25 MG tablet    Sig: Take 1 tablet (6.25 mg total) by mouth 2 (two) times daily with a meal.    Dispense:  60 tablet    Refill:  3    Follow-up: Return in about 3 months (around 09/21/2015) for Follow-up of hypertension and prediabetes.   Jaclyn Shaggy MD

## 2015-06-24 ENCOUNTER — Ambulatory Visit: Payer: BLUE CROSS/BLUE SHIELD | Attending: Family Medicine

## 2015-06-24 DIAGNOSIS — R7303 Prediabetes: Secondary | ICD-10-CM | POA: Diagnosis present

## 2015-06-24 DIAGNOSIS — I10 Essential (primary) hypertension: Secondary | ICD-10-CM

## 2015-06-24 DIAGNOSIS — E785 Hyperlipidemia, unspecified: Secondary | ICD-10-CM | POA: Diagnosis not present

## 2015-06-24 LAB — LIPID PANEL
Cholesterol: 142 mg/dL (ref 125–200)
HDL: 43 mg/dL (ref >=40)
LDL Cholesterol: 80 mg/dL (ref <130)
Total CHOL/HDL Ratio: 3.3 Ratio (ref <=5.0)
Triglycerides: 97 mg/dL (ref <150)
VLDL: 19 mg/dL (ref <30)

## 2015-06-24 LAB — COMPLETE METABOLIC PANEL WITH GFR
ALT: 16 U/L (ref 9–46)
AST: 17 U/L (ref 10–40)
Albumin: 4.7 g/dL (ref 3.6–5.1)
Alkaline Phosphatase: 84 U/L (ref 40–115)
BUN: 18 mg/dL (ref 7–25)
CO2: 30 mmol/L (ref 20–31)
Calcium: 9.5 mg/dL (ref 8.6–10.3)
Chloride: 93 mmol/L — ABNORMAL LOW (ref 98–110)
Creat: 1.04 mg/dL (ref 0.60–1.35)
GFR, Est African American: >89 mL/min (ref >=60)
GFR, Est Non African American: 85 mL/min (ref >=60)
Glucose, Bld: 126 mg/dL — ABNORMAL HIGH (ref 65–99)
Potassium: 3 mmol/L — ABNORMAL LOW (ref 3.5–5.3)
Sodium: 135 mmol/L (ref 135–146)
Total Bilirubin: 1 mg/dL (ref 0.2–1.2)
Total Protein: 8.3 g/dL — ABNORMAL HIGH (ref 6.1–8.1)

## 2015-06-24 LAB — HEMOGLOBIN A1C
Hgb A1c MFr Bld: 6.5 % — ABNORMAL HIGH (ref <5.7)
Mean Plasma Glucose: 140 mg/dL

## 2015-06-24 NOTE — Patient Instructions (Signed)
Patient is aware of receiving a FU call with results. 

## 2015-06-25 ENCOUNTER — Telehealth: Payer: Self-pay | Admitting: Family Medicine

## 2015-06-25 ENCOUNTER — Other Ambulatory Visit: Payer: Self-pay | Admitting: Family Medicine

## 2015-06-25 MED ORDER — LISINOPRIL 10 MG PO TABS: 10.0000 mg | ORAL_TABLET | Freq: Every day | ORAL | Status: DC

## 2015-06-25 NOTE — Telephone Encounter (Signed)
Called patient via [employer] [phone]. Patient was unable to be reached due to invalid phone number.

## 2015-06-25 NOTE — Telephone Encounter (Signed)
-----   Message from Jaclyn ShaggyEnobong Amao, MD sent at 06/25/2015  9:26 AM EDT ----- A1c is controlled at 6.5, lipid panel is normal, he does have hypokalemia and so I have discontinued amlodipine and replaced this with lisinopril. Advised increase intake of bananas and other potassium rich foods. Please schedule him for a repeat potassium level in one month.

## 2015-06-30 ENCOUNTER — Telehealth: Payer: Self-pay | Admitting: Family Medicine

## 2015-06-30 NOTE — Telephone Encounter (Signed)
He does have hypokalemia of 3.0 and does not need to be on it; I had [place] a result [place] to [place] this. He should be taking lisinopril.

## 2015-06-30 NOTE — Telephone Encounter (Signed)
Patient needs hydrochlorothiazide. Please follow up.

## 2015-06-30 NOTE — Telephone Encounter (Signed)
This is no longer on patient's medication list and I am not sure of the reason for discontinuation. Will forward the request to Dr. Venetia NightAmao.

## 2015-09-27 ENCOUNTER — Encounter: Payer: Self-pay | Admitting: Family Medicine

## 2015-09-27 ENCOUNTER — Ambulatory Visit: Payer: BLUE CROSS/BLUE SHIELD | Attending: Family Medicine | Admitting: Family Medicine

## 2015-09-27 VITALS — BP 127/76 | HR 64 | Temp 98.2°F | Ht 66.0 in | Wt 157.0 lb

## 2015-09-27 DIAGNOSIS — R05 Cough: Secondary | ICD-10-CM | POA: Insufficient documentation

## 2015-09-27 DIAGNOSIS — I10 Essential (primary) hypertension: Secondary | ICD-10-CM | POA: Insufficient documentation

## 2015-09-27 DIAGNOSIS — E119 Type 2 diabetes mellitus without complications: Secondary | ICD-10-CM

## 2015-09-27 DIAGNOSIS — E785 Hyperlipidemia, unspecified: Secondary | ICD-10-CM | POA: Diagnosis not present

## 2015-09-27 DIAGNOSIS — Z7982 Long term (current) use of aspirin: Secondary | ICD-10-CM | POA: Insufficient documentation

## 2015-09-27 DIAGNOSIS — R059 Cough, unspecified: Secondary | ICD-10-CM

## 2015-09-27 DIAGNOSIS — Z79899 Other long term (current) drug therapy: Secondary | ICD-10-CM | POA: Insufficient documentation

## 2015-09-27 LAB — COMPLETE METABOLIC PANEL WITH GFR
ALT: 19 U/L (ref 9–46)
AST: 14 U/L (ref 10–40)
Albumin: 4.5 g/dL (ref 3.6–5.1)
Alkaline Phosphatase: 72 U/L (ref 40–115)
BUN: 20 mg/dL (ref 7–25)
CO2: 22 mmol/L (ref 20–31)
Calcium: 9.3 mg/dL (ref 8.6–10.3)
Chloride: 105 mmol/L (ref 98–110)
Creat: 0.91 mg/dL (ref 0.60–1.35)
GFR, Est African American: >89 mL/min (ref >=60)
GFR, Est Non African American: >89 mL/min (ref >=60)
Glucose, Bld: 112 mg/dL — ABNORMAL HIGH (ref 65–99)
Potassium: 4.3 mmol/L (ref 3.5–5.3)
Sodium: 139 mmol/L (ref 135–146)
Total Bilirubin: 0.7 mg/dL (ref 0.2–1.2)
Total Protein: 7.7 g/dL (ref 6.1–8.1)

## 2015-09-27 MED ORDER — ATORVASTATIN CALCIUM 20 MG PO TABS: 20.0000 mg | ORAL_TABLET | Freq: Every day | ORAL | 3 refills | Status: DC

## 2015-09-27 MED ORDER — CARVEDILOL 6.25 MG PO TABS
6.2500 mg | ORAL_TABLET | Freq: Two times a day (BID) | ORAL | 3 refills | Status: DC
Start: 2015-09-27 — End: 2016-02-23

## 2015-09-27 MED ORDER — CETIRIZINE HCL 10 MG PO TABS: 10.0000 mg | ORAL_TABLET | Freq: Every day | ORAL | 2 refills | Status: DC

## 2015-09-27 MED ORDER — GLUCOSE BLOOD VI STRP: ORAL_STRIP | 5 refills | Status: AC

## 2015-09-27 MED ORDER — LISINOPRIL 10 MG PO TABS: 10.0000 mg | ORAL_TABLET | Freq: Every day | ORAL | 3 refills | Status: DC

## 2015-09-27 MED ORDER — ONETOUCH ULTRA SYSTEM W/DEVICE KIT: 1.0000 | PACK | Freq: Once | 0 refills | Status: AC

## 2015-09-27 NOTE — Progress Notes (Signed)
Subjective:  Patient ID: Fred Brown, male    DOB: 04-Aug-1965  Age: 50 y.o. MRN: [id number]  CC: Hypertension and Cough (x's three weeks)   HPI Fred Brown is a 50 year old man with a history of HTN, hyperlipidemia, Type 2 DM (A1c 6.5-Diet controlled), TIA, ascending aortic aneurysm who comes into the clinic for follow-up visit.  He reports compliance with medications, low-sodium diet and exercise and has not been out of any of his medications however he does have amlodipine 39m  which he has still been taking despite the fact that this was discontinued 3 months ago. There are no surgical recommendations as per cardiothoracic surgery made at this time for his ascending aortic dilatation but he is scheduled for 1 year follow-up.  He complains of a three-week history of cough, sore throat, dizziness and occasional sinus tenderness but denies fever. Cough is productive of clear sputum and he denies malaise or history of sick contacts.  Outpatient Medications Prior to Visit  Medication Sig Dispense Refill  . aspirin 325 MG tablet Take 1 tablet (325 mg total) by mouth daily. 30 tablet 3  . atorvastatin (LIPITOR) 20 MG tablet Take 1 tablet (20 mg total) by mouth daily at 6 PM. 30 tablet 3  . carvedilol (COREG) 6.25 MG tablet Take 1 tablet (6.25 mg total) by mouth 2 (two) times daily with a meal. 60 tablet 3  . lisinopril (PRINIVIL,ZESTRIL) 10 MG tablet Take 1 tablet (10 mg total) by mouth daily. 30 tablet 3  . cetirizine (ZYRTEC) 10 MG tablet Take 1 tablet (10 mg total) by mouth daily. 30 tablet 2   No facility-administered medications prior to visit.     ROS Review of Systems  Constitutional: Negative for activity change and appetite change.  HENT: Positive for sore throat. Negative for sinus pressure.   Eyes: Negative for visual disturbance.  Respiratory: Positive for cough. Negative for chest tightness and shortness of breath.   Cardiovascular: Negative for chest pain and leg swelling.    Gastrointestinal: Negative for abdominal distention, abdominal pain, constipation and diarrhea.  Endocrine: Negative.   Genitourinary: Negative for dysuria.  Musculoskeletal: Negative for joint swelling and myalgias.  Skin: Negative for rash.  Allergic/Immunologic: Negative.   Neurological: Positive for dizziness. Negative for weakness, light-headedness and numbness.  Psychiatric/Behavioral: Negative for dysphoric mood and suicidal ideas.    Objective:  BP 127/76 (BP Location: Right Arm, Patient Position: Sitting, Cuff Size: Large)   Pulse 64   Temp 98.2 F (36.8 C) (Oral)   Ht '5\' 6"'  (1.676 m)   Wt 157 lb (71.2 kg)   SpO2 99%   BMI 25.34 kg/m   BP/Weight 09/27/2015 06/21/2015 26/80/8811 Systolic BP [id number] Diastolic BP 76 60 93  Wt. (Lbs) 157 156.8 155  BMI 25.34 26.9 26.59      Physical Exam  Constitutional: He is oriented to person, place, and time. He appears well-developed and well-nourished.  Cardiovascular: Normal rate, normal heart sounds and intact distal pulses.   No murmur heard. Pulmonary/Chest: Effort normal and breath sounds normal. He has no wheezes. He has no rales. He exhibits no tenderness.  Abdominal: Soft. Bowel sounds are normal. He exhibits no distension and no mass. There is no tenderness.  Musculoskeletal: Normal range of motion.  Neurological: He is alert and oriented to person, place, and time.  Skin: Skin is warm and dry.  Psychiatric: He has a normal mood and affect.     CMP Latest Ref  Rng & Units 06/24/2015 02/27/2015 02/25/2015  Glucose 65 - 99 mg/dL 126(H) 121(H) 116(H)  BUN 7 - 25 mg/dL '18 19 14  ' Creatinine 0.60 - 1.35 mg/dL 1.04 0.94 0.87  Sodium 135 - 146 mmol/L 135 139 139  Potassium 3.5 - 5.3 mmol/L 3.0(L) 3.8 3.3(L)  Chloride 98 - 110 mmol/L 93(L) 106 105  CO2 20 - 31 mmol/L '30 24 22  ' Calcium 8.6 - 10.3 mg/dL 9.5 9.1 9.0  Total Protein 6.1 - 8.1 g/dL 8.3(H) - -  Total Bilirubin 0.2 - 1.2 mg/dL 1.0 - -  Alkaline Phos 40 - 115  U/L 84 - -  AST 10 - 40 U/L 17 - -  ALT 9 - 46 U/L 16 - -    Lipid Panel     Component Value Date/Time   CHOL 142 06/24/2015 0850   TRIG 97 06/24/2015 0850   HDL 43 06/24/2015 0850   CHOLHDL 3.3 06/24/2015 0850   VLDL 19 06/24/2015 0850   LDLCALC 80 06/24/2015 0850    Lab Results  Component Value Date   HGBA1C 6.5 (H) 06/24/2015    Assessment & Plan:   1. Hyperlipidemia Controlled - atorvastatin (LIPITOR) 20 MG tablet; Take 1 tablet (20 mg total) by mouth daily at 6 PM.  Dispense: 30 tablet; Refill: 3  2. Cough Along with dizziness and sore throat could be symptoms of chronic sinusitis - COMPLETE METABOLIC PANEL WITH GFR - cetirizine (ZYRTEC) 10 MG tablet; Take 1 tablet (10 mg total) by mouth daily.  Dispense: 30 tablet; Refill: 2  3. Essential hypertension Controlled -Lisinopril 10 mg daily - carvedilol (COREG) 6.25 MG tablet; Take 1 tablet (6.25 mg total) by mouth 2 (two) times daily with a meal.  Dispense: 60 tablet; Refill: 3  4. Type 2 diabetes mellitus without complication, without long-term current use of insulin (HCC) Diet controlled with A1c 6.5 Educated on diabetic teaching Patient to come in with glucometer for clinical pharmacy visit and additional diabetic teaching - Microalbumin / creatinine urine ratio - glucose blood (ONE TOUCH ULTRA TEST) test strip; Use as instructed  Dispense: 30 each; Refill: 5 - Blood Glucose Monitoring Suppl (ONE TOUCH ULTRA SYSTEM KIT) w/Device KIT; 1 kit by Does not apply route once.  Dispense: 1 each; Refill: 0   Meds ordered this encounter  Medications  . atorvastatin (LIPITOR) 20 MG tablet    Sig: Take 1 tablet (20 mg total) by mouth daily at 6 PM.    Dispense:  30 tablet    Refill:  3  . lisinopril (PRINIVIL,ZESTRIL) 10 MG tablet    Sig: Take 1 tablet (10 mg total) by mouth daily.    Dispense:  30 tablet    Refill:  3  . carvedilol (COREG) 6.25 MG tablet    Sig: Take 1 tablet (6.25 mg total) by mouth 2 (two) times  daily with a meal.    Dispense:  60 tablet    Refill:  3  . cetirizine (ZYRTEC) 10 MG tablet    Sig: Take 1 tablet (10 mg total) by mouth daily.    Dispense:  30 tablet    Refill:  2  . glucose blood (ONE TOUCH ULTRA TEST) test strip    Sig: Use as instructed    Dispense:  30 each    Refill:  5  . Blood Glucose Monitoring Suppl (ONE TOUCH ULTRA SYSTEM KIT) w/Device KIT    Sig: 1 kit by Does not apply route once.    Dispense:  1 each    Refill:  0    Please dispense lancets #[id number]    Follow-up: Return for follow up on Diabetes mellitus; 1 week with Erline Levine for Diabetic  teaching.   Arnoldo Morale MD

## 2015-09-27 NOTE — Progress Notes (Signed)
Non productive cough for three weeks Sore throat Dizziness medications are questionable

## 2015-09-27 NOTE — Patient Instructions (Signed)
Diabetes Mellitus and Food It is important for you to manage your blood sugar (glucose) level. Your blood glucose level can be greatly affected by what you eat. Eating healthier foods in the appropriate amounts throughout the day at about the same time each day will help you control your blood glucose level. It can also help slow or prevent worsening of your diabetes mellitus. Healthy eating may even help you improve the level of your blood pressure and reach or maintain a healthy weight.  General recommendations for healthful eating and cooking habits include:  Eating meals and snacks regularly. Avoid going long periods of time without eating to lose weight.  Eating a diet that consists mainly of plant-based foods, such as fruits, vegetables, nuts, legumes, and whole grains.  Using low-heat cooking methods, such as baking, instead of high-heat cooking methods, such as deep frying. Work with your dietitian to make sure you understand how to use the Nutrition Facts information on food labels. HOW CAN FOOD AFFECT ME? Carbohydrates Carbohydrates affect your blood glucose level more than any other type of food. Your dietitian will help you determine how many carbohydrates to eat at each meal and teach you how to count carbohydrates. Counting carbohydrates is important to keep your blood glucose at a healthy level, especially if you are using insulin or taking certain medicines for diabetes mellitus. Alcohol Alcohol can cause sudden decreases in blood glucose (hypoglycemia), especially if you use insulin or take certain medicines for diabetes mellitus. Hypoglycemia can be a life-threatening condition. Symptoms of hypoglycemia (sleepiness, dizziness, and disorientation) are similar to symptoms of having too much alcohol.  If your health care provider has given you approval to drink alcohol, do so in moderation and use the following guidelines:  Women should not have more than one drink per day, and men  should not have more than two drinks per day. One drink is equal to:  12 oz of beer.  5 oz of wine.  1 oz of hard liquor.  Do not drink on an empty stomach.  Keep yourself hydrated. Have water, diet soda, or unsweetened iced tea.  Regular soda, juice, and other mixers might contain a lot of carbohydrates and should be counted. WHAT FOODS ARE NOT RECOMMENDED? As you make food choices, it is important to remember that all foods are not the same. Some foods have fewer nutrients per serving than other foods, even though they might have the same number of calories or carbohydrates. It is difficult to get your body what it needs when you eat foods with fewer nutrients. Examples of foods that you should avoid that are high in calories and carbohydrates but low in nutrients include:  Trans fats (most processed foods list trans fats on the Nutrition Facts label).  Regular soda.  Juice.  Candy.  Sweets, such as cake, pie, doughnuts, and cookies.  Fried foods. WHAT FOODS CAN I EAT? Eat nutrient-rich foods, which will nourish your body and keep you healthy. The food you should eat also will depend on several factors, including:  The calories you need.  The medicines you take.  Your weight.  Your blood glucose level.  Your blood pressure level.  Your cholesterol level. You should eat a variety of foods, including:  Protein.  Lean cuts of meat.  Proteins low in saturated fats, such as fish, egg whites, and beans. Avoid processed meats.  Fruits and vegetables.  Fruits and vegetables that may help control blood glucose levels, such as apples, mangoes, and   yams.  Dairy products.  Choose fat-free or low-fat dairy products, such as milk, yogurt, and cheese.  Grains, bread, pasta, and rice.  Choose whole grain products, such as multigrain bread, whole oats, and brown rice. These foods may help control blood pressure.  Fats.  Foods containing healthful fats, such as nuts,  avocado, olive oil, canola oil, and fish. DOES EVERYONE WITH DIABETES MELLITUS HAVE THE SAME MEAL PLAN? Because every person with diabetes mellitus is different, there is not one meal plan that works for everyone. It is very important that you meet with a dietitian who will help you create a meal plan that is just right for you.   This information is not intended to replace advice given to you by your health care provider. Make sure you discuss any questions you have with your health care provider.   Document Released: 10/13/2004 Document Revised: 02/06/2014 Document Reviewed: 12/13/2012 Elsevier Interactive Patient Education 2016 [employer]  

## 2015-09-28 LAB — MICROALBUMIN / CREATININE URINE RATIO
Creatinine, Urine: 215 mg/dL (ref 20–370)
Microalb Creat Ratio: 6 mcg/mg creat (ref <30)
Microalb, Ur: 1.3 mg/dL

## 2015-10-05 ENCOUNTER — Ambulatory Visit: Payer: BLUE CROSS/BLUE SHIELD | Attending: Internal Medicine | Admitting: Pharmacist

## 2015-10-05 DIAGNOSIS — E119 Type 2 diabetes mellitus without complications: Secondary | ICD-10-CM

## 2015-10-05 MED ORDER — ONETOUCH ULTRASOFT LANCETS MISC: 12 refills | Status: AC

## 2015-10-05 MED ORDER — ONETOUCH ULTRA 2 W/DEVICE KIT: PACK | 0 refills | Status: AC

## 2015-10-05 NOTE — Progress Notes (Signed)
    S:    Patient arrives in good spirits, ambulating without assistance.  Presents for diabetes education at the request of Dr. Venetia NightAmao . Patient was referred on 09/27/15.  Patient was last seen by Primary Care Provider on 09/27/15. Visit was completed with aid of interpreter, Luisa Hartatrick [phone]). Pt presents with medication bottles and complains of productive cough "24 hours per day". Pt states that daily cetirizine has not helped.   Patient reports Diabetes was diagnosed in August, 2017.   Patient reports adherence with medications.  Current diabetes medications include: none- diet controlled  Current hypertension medications include: carvedilol 6.25 mg BID, lisinopril 10 mg daily   Patient denies hypoglycemic events.  Patient reported dietary habits: Eats "Asian" food he makes at home including rice, noodles, "lots of meat". Pt denies EtOH use at home and reports drinking only water.   Patient reported exercise habits: jogs 2 miles Thursdays, Fridays, Saturdays, Sundays. Does not exercise Mondays, Tuesdays, Wednesdays because of work.    Patient reports visual changes. Patient reports self foot exams.   O:  Lab Results  Component Value Date   HGBA1C 6.5 (H) 06/24/2015   There were no vitals filed for this visit.  Pt has not measured home blood glucoses as he does not have a meter.   10 year ASCVD risk: 4.4.   A/P: Diabetes newly diagnosed currently uncontrolled as evidenced by A1C of 6.5 on 5/25.  Control is suboptimal due to dietary indescretion. Counseled patient to continue jogging and to have no more than two consecutive days of inactivity each week. Goal is 150 mins of moderate-to-high intensity exercise. Spent majority of the visit counseling patient on the 'plate method' and carbohydrate-rich foods/drinks to limit. Taught patient how to properly check BG and instructed pt to take fasting blood glucose 3-4 days/week and 2-hr post-prandial blood glucose 2-3 days/week. Counseled  patient on fasting and post-prandial blood glucose goals. [facility] Instructed pt to bring in meter to next visit. Counseled patient to check feet daily. Informed patient of risks of uncontrolled T2DM including damage to kidneys, heart, and nerves. Next A1C anticipated at next visit with Dr. Venetia NightAmao.  Pt 53>45 yo with T2DM. ASCVD risk <7.5%. Continue atorvastatin 20 mg daily.  Pt complaining of persistent cough despite cetirizine daily. Instructed pt to make an appointment with Dr. Venetia NightAmao to address cough.   Written patient instructions provided.  Total time in face to face counseling 30 minutes.   Follow up in Pharmacist Clinic Visit as needed at Dr. Jen MowAmao's descretion.   Patient seen with Tasia CatchingsZach Lingle, PharmD Candidate and Devota Pacearoline Welles, PGY1 Pharmacy Resident.

## 2015-10-05 NOTE — Patient Instructions (Addendum)
Thanks for coming to see Fred Brown!  Follow up with Dr. Venetia Night for cough  B?nh ti?u ???ng tp 2, Ng??i l?n (Type 2 Diabetes Mellitus, Adult) B?nh ti?u ???ng tp 2, th??ng g?i ??n gi?n l ti?u ???ng tp 2, l m?t b?nh ko di (m?n tnh). Trong ti?u ???ng tp 2, tuy?n t?y khng s?n xu?t ?? insulin (hocmon), cc t? bo t ?p ?ng v?i insulin lm cho ( khng insulin), ho?c c? hai. Thng th??ng, insulin v?n chuy?n ???ng t? th?c ?n vo cc t? bo ? m. Cc t? bo ? m s? d?ng ???ng ?? s?n sinh ra n?ng l??ng. Thi?u h?t insulin ho?c khng ?p ?ng bnh th??ng v?i insulin gy ra l??ng ???ng d? th?a tch t? trong mu thay v ?i vo cc t? bo ? m. K?t qu? l, lm cho l??ng ???ng trong mu cao (t?ng ???ng huy?t). ?nh h??ng c?a hm l??ng ???ng (glucose) cao c th? gy ra nhi?u bi?n ch?ng.  B?nh ti?u ???ng tp 2 tr??c ?y cn ???c g?i l b?nh ti?u ???ng kh?i pht ? ng??i l?n, nh?ng n c th? x?y ra ? b?t c? l?a tu?i no.  CC Y?U T? NGUY C?  M?t ng??i d? b? b?nh ti?u ???ng tp 2 n?u c ai ? trong gia ?nh b? b?nh ny, ??ng th?i c m?t ho?c nhi?u y?u t? nguy c? chnh sau ?y:  T?ng cn ho?c th?a cn ho?c bo ph.  L?i s?ng t ho?t ??ng.  Ti?n s? lin t?c ?n th?c ?n nhi?u n?ng l??ng. Duy tr cn n?ng bnh th??ng v ho?t ??ng thn th? th??ng xuyn c th? lm gi?m nguy c? pht tri?n b?nh ti?u ???ng tp 2. TRI?U CH?NG  Ban ??u, m?t ng??i b? b?nh ti?u ???ng tp 2 c th? khng c cc tri?u ch?ng. Cc tri?u ch?ng c?a b?nh ti?u ???ng tp 2 xu?t hi?n t? t?. Cc tri?u ch?ng bao g?m:  Kht n??c nhi?u (ch?ng kht nhi?u).  Ti?u ti?n nhi?u (?a ni?u).  ?i ti?u nhi?u vo ban ?m (ti?u ?m).  Thay ??i cn n?ng m?t cch ??t ng?t ho?c khng r nguyn nhn.  Th??ng xuyn b? nhi?m trng ti pht.  M?t m?i (m?t)  Y?u.  Thay ??i th? l?c, ch?ng h?n nh? nhn m?.  Mi tri cy trong h?i th? c?a qu v?.  ?au b?ng.  Bu?n nn ho?c nn m?a.  V?t c?t ho?c v?t b?m tm lu lnh.  ?au bu?t ho?c t ? bn tay ho?c bn  chn.  V?t th??ng h? trn da (lot). CH?N ?ON B?nh ti?u ???ng tp 2 th??ng khng ???c ch?n ?on cho ??n khi xu?t hi?n cc bi?n ch?ng c?a b?nh ti?u ???ng. B?nh ti?u ???ng tp 2 ???c ch?n ?on khi xu?t hi?n cc tri?u ch?ng c?ng nh? bi?n ch?ng v khi l??ng ???ng huy?t t?ng. L??ng ???ng huy?t c th? ???c ki?m tra b?ng m?t ho?c nhi?u xt nghi?m mu sau ?y:  Xt nghi?m ???ng huy?t lc ?i. Qu v? s? khng ???c php ?n trong t nh?t l 8 ti?ng tr??c khi l?y m?u mu.  Xt nghi?m ???ng huy?t ng?u nhin. ???ng huy?t ???c xt nghi?m b?t k? lc no trong ngy, b?t k? qu v? ?n lc no.  Xt nghi?m ???ng huy?t A1c hemoglobin. Xt nghi?m A1c hemoglobin cung c?p thng tin v? vi?c ki?m sot ???ng huy?t trong 3 thng tr??c ?.  Xt nghi?m dung n?p glucose theo ???ng u?ng (OGTT). ???ng huy?t c?a qu v? ???c ?o sau khi qu v? ch?a ?n (nh?n ?n) trong 2  gi? v sau ? l sau khi qu v? u?ng ?? u?ng c ch?a glucose. ?I?U TR?   Qu v? c th? c?n dng insulin ho?c thu?c tr? ti?u ???ng hng ngy ?? gi? cho l??ng ???ng huy?t trong ph?m vi mong mu?n.  N?u qu v? dng insulin, qu v? c th? c?n ?i?u ch?nh li?u thu?c ty thu?c vo l??ng carbohydrate m qu v? ?n trong m?i b?a ?n chnh ho?c b?a ?n nh?.  Thay ??i l?i s?ng ???c khuy?n ngh? l m?t ph?n trong bi?n php ?i?u tr? c?a qu v?. Nh?ng thay ??i ny c th? bao g?m:  Tun theo m?t ch? ?? ?n c nhn ha do m?t chuyn gia dinh d??ng ??a ra.  T?p th? d?c hng ngy. Chuyn gia ch?m [place] s?c kh?e s? ??t cc m?c tiu ?i?u tr? c nhn ha cho qu v? d?a theo tu?i, cc lo?i thu?c c?a qu v?, th?i gian qu v? ? b? ti?u t??ng, v b?t k? tnh tr?ng b?nh l no khc m qu v? c. Thng th??ng, m?c tiu ?i?u tr? l duy tr n?ng ?? glucose trong mu:  Tr??c khi ?n (tr??c b?a ?n): 80-130 mg/dL.  Sau khi ?n (sau b?a ?n): d??i 180 mg/dL.  A1c: th?p h?n 6,5-7%. H??NG D?N CH?M [place] T?I NH   L??ng A1c hemoglobin c?a qu v? ???c ki?m tra hai l?n m?i n?m.  Th?c hi?n vi?c theo  di ???ng huy?t hng ngy theo ch? d?n c?a chuyn gia ch?m [place] s?c kh?e.  Theo di [place] trong n??c ti?u khi qu v? b? b?nh v theo ch? d?n c?a chuyn gia ch?m [place] s?c kh?e.  S? d?ng thu?c tr? ti?u ???ng ho?c insulin theo ch? d?n c?a chuyn gia ch?m [place] s?c kh?e ?? duy tr l??ng ???ng huy?t trong ph?m vi mong mu?n.  Khng bao gi? ?? h?t thu?c tr? ti?u ???ng ho?c insulin. Thu?c c?n ph?i dng hng ngy.  N?u qu v? ?ang dng insulin, qu v? c th? ?i?u ch?nh l??ng insulin d?a vo l??ng carbohydrates qu v? ?n. Carbohydrate c th? lm t?ng l??ng ???ng huy?t nh?ng c?n ph?i bao g?m trong ch? ?? ?n u?ng c?a qu v?. Carbohydrate cung c?p vitamin, khong ch?t v ch?t x?, l m?t ph?n thi?t y?u c?a ch? ?? ?n u?ng c l?i cho s?c kh?e. Carbohydrate ???c tm th?y trong tri cy, rau, ng? c?c, cc s?n ph?m t? s?a, cc lo?i ??u v cc lo?i th?c ph?m c b? sung thm ???ng.  ?n th?c ?n c l?i cho s?c kh?e. Qu v? c?n h?n g?p m?t chuyn gia dinh d??ng c ??ng k hnh ngh? ?? gip qu v? ??a ra m?t k? ho?ch ?n u?ng ph h?p.  Gi?m cn n?u qu v? th?a cn.  Mang theo th? c?nh bo y t? ho?c ?eo ?? trang s?c c c?nh bo y t?.  Mang theo ?? ?n nh? ch?a 15 gam carbohydrate m?i lc ?? ?i?u tr? h? ???ng huy?t (h? ???ng huy?t). M?t s? v d? v? ?? ?n nh? ch?a 15 gam carbohydrate bao g?m:  Vin glucose, 3 ho?c 4.  Gel glucose, ?ng 15 gam.  Nho kh, 2 mu?ng (24 gam).  Th?ch hnh h?t ??u, 6.  Bnh quy hnh con gi?ng, 8.  N??c u?ng c ga thng th??ng, 4 aox? (120 ml)  K?o chp chp, 9.  Nh?n bi?t h? ???ng huy?t. H? ???ng huy?t x?y ra khi l??ng ???ng huy?t t? 70 mg/dL tr? xu?ng. Nguy c? h? ???ng huy?t gia t?ng khi nh?n ?n ho?c  b? b?a, trong v sau khi t?p th? d?c c??ng ?? cao v trong khi ng?. Cc tri?u ch?ng h? ???ng huy?t c th? bao g?m:  Run ho?c l?c.  Gi?m kh? n?ng t?p trung.  ?? m? hi.  Nh?p tim t?ng.  ?au ??u.  Kh mi?ng.  ?i.  D? b? kch thch.  Lo u.  Ng? khng yn.  Thay ??i l?i ni  ho?c s? ph?i h?p.  B? l l?n.  ?i?u tr? h? ???ng huy?t k?p th?i. N?u qu v? t?nh to v c th? nu?t m?t cch an ton, hy theo quy t?c 15:15:  Dng 15-20 gam glucose ho?c carbohydrate c tc d?ng nhanh. L?a ch?n tc ??ng nhanh bao g?m gel glucose, vin glucose ho?c 4 aox? (120 ml) n??c p tri cy, soda bnh th??ng ho?c s?a t bo.  Ki?m tra l??ng ???ng huy?t c?a qu v? 15 pht sau khi u?ng glucose.  Dng t? 15-20 gam glucose tr? ln n?u l??ng ???ng huy?t ???c ?o l?i v?n ? m?c 70 mg/dL tr? xu?ng.  ?n theo b?a ?n bnh th??ng ho?c ?? ?n nh? trong vng 1 ti?ng sau khi l??ng ???ng huy?t tr? l?i bnh th??ng.  Hy c?nh gic v?i c?m gic r?t kht v ?i ti?u ti?n nhi?u l?n h?n bnh th??ng v ?y l nh?ng d?u hi?u s?m c?a t?ng ???ng huy?t. Vi?c pht hi?n t?ng ???ng huy?t s?m cho php ?i?u tr? k?p th?i. ?i?u tr? t?ng ???ng huy?t theo ch? d?n c?a chuyn gia ch?m [place] s?c kh?e.  M?i tu?n tham gia vo t nh?t 150 pht ho?t ??ng thn th? v?i c??ng ?? trung bnh, phn b? trong t nh?t 3 ngy trong tu?n ho?c theo ch? d?n c?a chuyn gia ch?m [place] s?c kh?e. Ngoi ra, qu v? nn tham gia vo bi t?p c s?c c?n t nh?t 2 l?n m?t tu?n ho?c theo ch? d?n c?a chuyn gia ch?m [place] s?c kh?e. C? g?ng dnh khng qu 90 pht m?i l?n khng ho?t ??ng.  ?i?u ch?nh thu?c v l??ng th?c ?n khi c?n n?u qu v? b?t ??u m?t bi t?p ho?c m?t mn th? thao m?i.  Lm theo k? ho?ch trong ngy b? b?nh c?a qu v? b?t c? lc no m qu v? khng th? ?n ho?c u?ng nh? bnh th??ng.  Khng s? d?ng cc s?n ph?m thu?c l bao g?m thu?c l ht, thu?c l d?ng nhai ho?c thu?c l ?i?n t?. N?u qu v? c?n gip ?? ?? cai thu?c, hy h?i chuyn gia ch?m [place] s?c kh?e.  Gi?i h?n l??ng r??u qu v? u?ng khng qu 1 ly m?i ngy v?i ph? n? khng mang thai v 2 ly m?i ngy v?i nam gi?i. Qu v? ch? nn u?ng r??u khi ?n. Ni chuy?n v?i chuyn gia ch?m [place] s?c kh?e xem u?ng r??u c an ton cho qu v? hay khng. Cho chuyn gia ch?m [place] s?c kh?e bi?t n?u qu v? u?ng  r??u vi l?n m?i tu?n.  Tun th? m?i cu?c h?n khm l?i theo ch? d?n c?a chuyn gia ch?m [place] s?c kh?e. ?i?u ny l quan tr?ng.  S?p x?p bu?i khm m?t ngay sau khi ch?n ?on b?nh ti?u ???ng tp 2 v sau ? l hng n?m.  Th?c hi?n ch?m [place] da v bn chn hng ngy. Ki?m tra da v bn chn hng ngy xem c v?t c?t, v?t b?m tm, t?y ??, v?n ?? v? mng, ch?y mu, m?n n??c hay l? lot khng. Bn chn c?n ???c chuyn gia ch?m [employer] s?c  kh?e khm hng n?m.  ?nh r?ng v l?i t nh?t hai l?n m?i ngy v dng ch? nha khoa t nh?t m?t l?n m?i ngy. G?p nha s? ?? khm l?i th??ng xuyn.  Chia s? k? ho?ch qu?n l b?nh ti?u ???ng c?a qu v? ? n?i lm vi?c ho?c tr??ng h?c c?a qu v?.  C?p nh?t l?ch tim ch?ng c?a qu v?. Qu v? nn tim v?c xin cm (b?nh cm) hng n?m. Qu v? c?ng nn tim v?c xin vim ph?i (ph? c?u khu?n). N?u qu v? 67 tu?i tr? ln v ch?a ???c tim v?c xin vim ph?i, v?c xin ny c th? ???c tim m?t lo?t hai m?i ring. Hy h?i chuyn gia ch?m [place] s?c kh?e xem nn tim thm lo?i v?c xin no.  H?c cch qu?n l c?ng th?ng.  Xin ???c gio d?c v h? tr? v? b?nh ti?u ???ng th??ng xuyn khi c?n.  Tham gia ho?c tm cch ph?c h?i ch?c n?ng khi c?n thi?t ?? duy tr ho?c c?i thi?n kh? n?ng ??c l?p v ch?t l??ng cu?c s?ng. Yu c?u chuy?n sang v?t l tr? li?u ho?c li?u php ngh? nghi?p n?u qu v? b? t bn chn ho?c t tay, ho?c kh ch?i ??u, kh m?c qu?n o, ?n u?ng ho?c ho?t ??ng th? ch?t. ?I K[facility] N?U:   Qu v? khng th? ?n ho?c u?ng trong h?n 6 ti?ng.  Qu v? b? bu?n nn v nn m?a trong h?n 6 ti?ng.  L??ng ???ng huy?t c?a qu v? cao trn 240 mg/dL.  C thay ??i tr?ng thi tinh th?n.  Qu v? b? thm m?t c?n b?nh nghim tr?ng.  Qu v? b? tiu ch?y trong h?n 6 ti?ng.  Qu v? ? b? ?m ho?c b? s?t trong m?t vi ngy v khng ?? h?n.  Qu v? b? ?au trong khi tham gia b?t k? ho?t ??ng thn th? no. NGAY L?P T?C ?I K[facility] N?U:  Qu v? b? kh th?.  Qu v? c l??ng [place] ? m?c trung bnh ??n  cao.   Thng tin ny khng nh?m m?c ?ch thay th? cho l?i khuyn m chuyn gia ch?m [place] s?c kh?e ni v?i qu v?. Hy b?o ??m qu v? ph?i th?o lu?n b?t k? v?n ?? g m qu v? c v?i chuyn gia ch?m [place] s?c kh?e c?a qu v?.   Document Released: 01/16/2005 Document Revised: 10/07/2014 Elsevier Interactive Patient Education [employer]

## 2015-10-12 ENCOUNTER — Telehealth: Payer: Self-pay

## 2015-10-12 NOTE — Telephone Encounter (Signed)
-----   Message from Jaclyn ShaggyEnobong Amao, MD sent at 09/28/2015  5:09 PM EDT ----- Blood work came back normal.

## 2015-10-12 NOTE — Telephone Encounter (Signed)
Through [employer] writer attempted to call patient.  LVM for patient to return call.

## 2015-10-15 ENCOUNTER — Telehealth: Payer: Self-pay

## 2015-10-15 NOTE — Telephone Encounter (Signed)
Several times through [employer] patient was called and a VM was left to let him know that writer is trying to get a hold of him to give him lab results. Writer mailed a letter today letting him know his lab results were normal.

## 2015-10-15 NOTE — Telephone Encounter (Signed)
-----   Message from Enobong Amao, MD sent at 09/28/2015  5:09 PM EDT ----- Blood work came back normal. 

## 2015-11-26 ENCOUNTER — Ambulatory Visit: Payer: Self-pay | Attending: Internal Medicine | Admitting: Physician Assistant

## 2015-11-26 ENCOUNTER — Encounter: Payer: Self-pay | Admitting: Physician Assistant

## 2015-11-26 VITALS — BP 133/88 | HR 66 | Temp 98.0°F | Resp 16 | Wt 154.8 lb

## 2015-11-26 DIAGNOSIS — J209 Acute bronchitis, unspecified: Secondary | ICD-10-CM

## 2015-11-26 DIAGNOSIS — E1100 Type 2 diabetes mellitus with hyperosmolarity without nonketotic hyperglycemic-hyperosmolar coma (NKHHC): Secondary | ICD-10-CM

## 2015-11-26 DIAGNOSIS — Z7982 Long term (current) use of aspirin: Secondary | ICD-10-CM | POA: Insufficient documentation

## 2015-11-26 DIAGNOSIS — Z23 Encounter for immunization: Secondary | ICD-10-CM

## 2015-11-26 DIAGNOSIS — Z79899 Other long term (current) drug therapy: Secondary | ICD-10-CM | POA: Insufficient documentation

## 2015-11-26 DIAGNOSIS — I10 Essential (primary) hypertension: Secondary | ICD-10-CM

## 2015-11-26 LAB — POCT GLYCOSYLATED HEMOGLOBIN (HGB A1C): Hemoglobin A1C: 5.9

## 2015-11-26 LAB — GLUCOSE, POCT (MANUAL RESULT ENTRY): POC Glucose: 105 mg/dl — AB (ref 70–99)

## 2015-11-26 MED ORDER — AZITHROMYCIN 250 MG PO TABS: ORAL_TABLET | ORAL | 0 refills | Status: DC

## 2015-11-26 MED ORDER — BENZONATATE 200 MG PO CAPS: 200.0000 mg | ORAL_CAPSULE | Freq: Two times a day (BID) | ORAL | 0 refills | Status: DC | PRN

## 2015-11-26 MED FILL — AZITHROMYCIN 250 MG TABLET: 250 | 5 days supply | Qty: 6 | Fill #0

## 2015-11-26 MED FILL — BENZONATATE 100 MG CAPSULE: 100 | 10 days supply | Qty: 40 | Fill #0

## 2015-11-26 NOTE — Patient Instructions (Addendum)
Influenza Virus Vaccine injection (Fluarix) ?y l thu?c g? THU?C CHU?NG NG??A VIRUS CU?M (INFLUENZA) giu?p gia?m nguy c? b? b?nh cu?m (influenza), cn ???c g?i l c?m cm (flu). Thu?c ny c th? ???c dng cho nh?ng m?c ?ch khc; hy h?i ng??i cung c?p d?ch v? y t? ho?c d??c s? c?a mnh, n?u qu v? c th?c m?c. Ti c?n ph?i bo cho ng??i cung c?p d?ch v? y t? c?a mnh ?i?u g tr??c khi dng thu?c ny? H? c?n bi?t li?u qu v? c b?t k? tnh tr?ng no sau ?y khng: -cc r?i loa?n v? ch?y mu, ch?ng h?n nh? b?nh ?a ch?y mu (hemophilia) -s?t, nhi?m trng -H?i ch??ng Guillain-Barre ho??c ca?c v?n ?? khc v? th?n kinh -cc v?n ?? v? h? mi?n d?ch -nhi?m HIV ho?c AIDS -l??ng ti?u c?u trong ma?u th?p -ch?ng ?a x? c?ng -pha?n ??ng b?t th???ng ho??c di? ??ng v??i thu?c chu?ng ng??a virus cu?m (influenza), tr??ng, protein cu?a ga?, nh?a latex ho??c gentamicin -pha?n ??ng b?t th???ng ho??c di? ??ng v??i ca?c d??c ph?m kha?c, th?c ph?m, thu?c nhu?m, ho??c ch?t ba?o qua?n -?ang c thai ho??c ??nh co? thai -?ang cho con bu? Ti nn s? d?ng thu?c ny nh? th? no? Thu?c ny ?? tim vo b?p th?t. Thu?c th??ng ???c s? d?ng b?i chuyn vin y t?. T? [place] v? Thu?c Ch?ng Ng?a s? ???c pht tr??c m?i l?n ch?ng ng?a. Hy ??c k? t? thng tin ny m?i l?n. T? thng tin c th? thay ??i th??ng xuyn. Hy bn v?i bc s? nhi khoa c?a qu v? v? vi?c dng thu?c ny ? tr? em. C th? c?n ch?m [place] ??c bi?t. Qu li?u: N?u qu v? cho r?ng mnh ? dng qu nhi?u thu?c ny, th hy lin l?c v?i trung tm ki?m sot ch?t ??c ho?c phng c?p c?u ngay l?p t?c. L?U : Thu?c ny ch? dnh ring cho qu v?. Khng chia s? thu?c ny v?i nh?ng ng??i khc. N?u ti l? qun m?t li?u th sao? ?i?u ny khng p d?ng. Nh?ng g c th? t??ng tc v?i thu?c ny? -ho?a tri? li?u ho??c xa? tri? li?u -m?t s? thu?c la?m y?u h? th?ng mi?n d?ch c?a qu v?, ch?ng h?n nh? etanercept, anakinra, infliximab, va? adalimumab -m?t s? thu?c  ?i?u tr? ho?c phng ng?a c?c mu ?ng, ch?ng h?n nh? warfarin -phenytoin -cc thu?c steroid, ch?ng h?n nh? prednisone ho?c cortisone -theophylline -cc thu?c ch?ng ng?a Danh sch ny c th? khng m t? ?? h?t cc t??ng tc c th? x?y ra. Hy ??a cho ng??i cung c?p d?ch v? y t? c?a mnh danh sch t?t c? cc thu?c, th?o d??c, cc thu?c khng c?n toa, ho?c cc ch? ph?m b? sung m qu v? dng. C?ng nn bo cho h? bi?t r?ng qu v? c ht thu?c, u?ng r??u, ho?c c s? d?ng ma ty tri php hay khng. Vi th? c th? t??ng tc v?i thu?c c?a qu v?. Ti c?n ph?i theo di ?i?u g trong khi dng thu?c ny? Ha?y ba?o cho ba?c si? ho??c chuyn vin y t? bi?t b?t ky? ta?c du?ng phu? na?o khng bi?n m?t trong vo?ng 3 nga?y. Ha?y lin la?c v??i ba?c si? ho??c chuyn vin y t? c?a mnh, n?u b?t ky? tri?u ch??ng b?t th???ng na?o xu?t hi?n trong vo?ng 6 Berdella?n k? t?? khi tim thu?c chu?ng ng??a na?y. Quy? vi? v?n co? th? b? cu?m, nh?ng b?nh se? khng n??ng nh? th???ng l?. Quy? vi? khng th? b? cu?m do thu?c chu?ng ng??a. Thu?c chu?ng ng??a  se? khng ba?o v? kho?i cc ch?ng ca?m la?nh ho??c ca?c b?nh co? th? gy s?t khc. C?n tim thu?c chu?ng ng??a na?y ha?ng n?m. Ti c th? nh?n th?y nh?ng tc d?ng ph? no khi dng thu?c ny? Nh?ng tc d?ng ph? qu v? c?n ph?i bo cho bc s? ho?c chuyn vin y t? cng s?m cng t?t: -cc ph?n ?ng d? ?ng, ch?ng h?n nh? da b? m?n ??, ng?a, n?i my ?ay, s?ng ? m?t, mi, ho?c l??i Cc tc d?ng ph? khng c?n ph?i ch?m Dargan y t? (hy bo cho bc s? ho?c chuyn vin y t?, n?u cc tc d?ng ph? ny ti?p di?n ho?c gy phi?n toi): -s?t -?au ??u -?au ho?c nh?c c? b?p -b? ?au,  ?m, ??, ho?c s?ng ? ch? tim -m?t m?i ho?c y?u ?t Danh sch ny c th? khng m t? ?? h?t cc tc d?ng ph? c th? x?y ra. Xin g?i t?i bc s? c?a mnh ?? ???c c? v?n chuyn mn v? cc tc d?ng ph?Ladell Heads. Qu v? c th? t??ng trnh cc tc d?ng ph? cho FDA theo s? [phone]. Ti nn c?t gi? thu?c c?a  mnh ? ?u? Thu?c chu?ng ng??a na?y se? ????c tim b??i chuyn vin y t? ta?i pho?ng ma?ch, hi?u thu?c, v?n pho?ng ba?c si? ho??c c? s?? y t? kha?c. Thu?c ny khng ???c c?t gi? t?i nh. L?U : ?y l b?n tm t?t. N c th? khng bao hm t?t c? thng tin c th? c. N?u qu v? th?c m?c v? thu?c ny, xin trao ??i v?i bc s?, d??c s?, ho?c ng??i cung c?p d?ch v? y t? c?a mnh.    2016, Elsevier/Gold Standard. (2011-02-21 00:00:00)  Tdap Vaccine (Tetanus, Diphtheria and Pertussis): What You Need to Know 1. Why get vaccinated? Tetanus, diphtheria and pertussis are very serious diseases. Tdap vaccine can protect us from these diseases. And, Tdap vaccine given to pregnant women can protect newborn babies against pertussis. TETANUS (Lockjaw) is rare in the [place] today. It causes painful muscle tightening and stiffness, usually all over the body.  It can lead to tightening of muscles in the head and neck so you can't open your mouth, swallow, or sometimes even breathe. Tetanus kills about 1 out of 10 people who are infected even after receiving the best medical care. DIPHTHERIA is also rare in the [place] today. It can cause a thick coating to form in the back of the throat.  It can lead to breathing problems, heart failure, paralysis, and death. PERTUSSIS (Whooping Cough) causes severe coughing spells, which can cause difficulty breathing, vomiting and disturbed sleep.  It can also lead to weight loss, incontinence, and rib fractures. Up to 2 in 100 adolescents and 5 in 100 adults with pertussis are hospitalized or have complications, which could include pneumonia or death. These diseases are caused by bacteria. Diphtheria and pertussis are spread from person to person through secretions from coughing or sneezing. Tetanus enters the body through cuts, scratches, or wounds. Before vaccines, as many as 200,000 cases of diphtheria, 200,000 cases of pertussis, and hundreds of cases of  tetanus, were reported in the [place] each year. Since vaccination began, reports of cases for tetanus and diphtheria have dropped by about 99% and for pertussis by about 80%. 2. Tdap vaccine Tdap vaccine can protect adolescents and adults from tetanus, diphtheria, and pertussis. One dose of Tdap is routinely given at age 48 or 7912. People who did not get Tdap at that age should get  it as soon as possible. Tdap is especially important for healthcare professionals and anyone having close contact with a baby younger than 12 months. Pregnant women should get a dose of Tdap during every pregnancy, to protect the newborn from pertussis. Infants are most at risk for severe, life-threatening complications from pertussis. Another vaccine, called Td, protects against tetanus and diphtheria, but not pertussis. A Td booster should be given every 10 years. Tdap may be given as one of these boosters if you have never gotten Tdap before. Tdap may also be given after a severe cut or burn to prevent tetanus infection. Your doctor or the person giving you the vaccine can give you more information. Tdap may safely be given at the same time as other vaccines. 3. Some people should not get this vaccine  A person who has ever had a life-threatening allergic reaction after a previous dose of any diphtheria, tetanus or pertussis containing vaccine, OR has a severe allergy to any part of this vaccine, should not get Tdap vaccine. Tell the person giving the vaccine about any severe allergies.  Anyone who had coma or long repeated seizures within 7 days after a childhood dose of DTP or DTaP, or a previous dose of Tdap, should not get Tdap, unless a cause other than the vaccine was found. They can still get Td.  Talk to your doctor if you:  have seizures or another nervous system problem,  had severe pain or swelling after any vaccine containing diphtheria, tetanus or pertussis,  ever had a condition called  Guillain-Barr Syndrome (GBS),  aren't feeling well on the day the shot is scheduled. 4. Risks With any medicine, including vaccines, there is a chance of side effects. These are usually mild and go away on their own. Serious reactions are also possible but are rare. Most people who get Tdap vaccine do not have any problems with it. Mild problems following Tdap (Did not interfere with activities)  Pain where the shot was given (about 3 in 4 adolescents or 2 in 3 adults)  Redness or swelling where the shot was given (about 1 person in 5)  Mild fever of at least 100.82F (up to about 1 in 25 adolescents or 1 in 100 adults)  Headache (about 3 or 4 people in 10)  Tiredness (about 1 person in 3 or 4)  Nausea, vomiting, diarrhea, stomach ache (up to 1 in 4 adolescents or 1 in 10 adults)  Chills, sore joints (about 1 person in 10)  Body aches (about 1 person in 3 or 4)  Rash, swollen glands (uncommon) Moderate problems following Tdap (Interfered with activities, but did not require medical attention)  Pain where the shot was given (up to 1 in 5 or 6)  Redness or swelling where the shot was given (up to about 1 in 16 adolescents or 1 in 12 adults)  Fever over 102F (about 1 in 100 adolescents or 1 in 250 adults)  Headache (about 1 in 7 adolescents or 1 in 10 adults)  Nausea, vomiting, diarrhea, stomach ache (up to 1 or 3 people in 100)  Swelling of the entire arm where the shot was given (up to about 1 in 500). Severe problems following Tdap (Unable to perform usual activities; required medical attention)  Swelling, severe pain, bleeding and redness in the arm where the shot was given (rare). Problems that could happen after any vaccine:  People sometimes faint after a medical procedure, including vaccination. Sitting or lying down for about  15 minutes can help prevent fainting, and injuries caused by a fall. Tell your doctor if you feel dizzy, or have vision changes or ringing  in the ears.  Some people get severe pain in the shoulder and have difficulty moving the arm where a shot was given. This happens very rarely.  Any medication can cause a severe allergic reaction. Such reactions from a vaccine are very rare, estimated at fewer than 1 in a million doses, and would happen within a few minutes to a few hours after the vaccination. As with any medicine, there is a very remote chance of a vaccine causing a serious injury or death. The safety of vaccines is always being monitored. For more information, visit: http://floyd.org/ 5. What if there is a serious problem? What should I look for?  Look for anything that concerns you, such as signs of a severe allergic reaction, very high fever, or unusual behavior.  Signs of a severe allergic reaction can include hives, swelling of the face and throat, difficulty breathing, a fast heartbeat, dizziness, and weakness. These would usually start a few minutes to a few hours after the vaccination. What should I do?  If you think it is a severe allergic reaction or other emergency that can't wait, call 9-1-1 or get the person to the nearest hospital. Otherwise, call your doctor.  Afterward, the reaction should be reported to the Vaccine Adverse Event Reporting System (VAERS). Your doctor might file this report, or you can do it yourself through the VAERS web site at www.vaers.LAgents.no, or by calling 1-(209)059-8854. VAERS does not give medical advice.  6. The National Vaccine Injury Compensation Program The [employer] Vaccine Injury Compensation Program (VICP) is a federal program that was created to compensate people who may have been injured by certain vaccines. Persons who believe they may have been injured by a vaccine can learn about the program and about filing a claim by calling 1-2145299438 or visiting the VICP website at SpiritualWord.at. There is a time limit to file a claim for compensation. 7.  How can I learn more?  Ask your doctor. He or she can give you the vaccine package insert or suggest other sources of information.  Call your local or state health department.  Contact the Centers for Disease Control and Prevention (CDC):  Call 251-849-2496 (1-800-CDC-INFO) or  Visit CDC's website at PicCapture.uy CDC Tdap Vaccine VIS (03/25/13)   This information is not intended to replace advice given to you by your health care provider. Make sure you discuss any questions you have with your health care provider.   Document Released: 07/18/2011 Document Revised: 02/06/2014 Document Reviewed: 04/30/2013 Elsevier Interactive Patient Education 2016 Elsevier Inc.  Pneumococcal Polysaccharide Vaccine: What You Need to Know 1. Why get vaccinated? Vaccination can protect older adults (and some children and younger adults) from pneumococcal disease. Pneumococcal disease is caused by bacteria that can spread from person to person through close contact. It can cause ear infections, and it can also lead to more serious infections of the:   Lungs (pneumonia),  Blood (bacteremia), and  Covering of the brain and spinal cord (meningitis). Meningitis can cause deafness and brain damage, and it can be fatal. Anyone can get pneumococcal disease, but children under 47 years of age, people with certain medical conditions, adults over 77 years of age, and cigarette smokers are at the highest risk. About 18,000 older adults die each year from pneumococcal disease in the [place]. Treatment of pneumococcal infections with penicillin and  other drugs used to be more effective. But some strains of the disease have become resistant to these drugs. This makes prevention of the disease, through vaccination, even more important. 2. Pneumococcal polysaccharide vaccine (PPSV23) Pneumococcal polysaccharide vaccine (PPSV23) protects against 23 types of pneumococcal bacteria. It will not prevent all  pneumococcal disease. PPSV23 is recommended for:  All adults 81 years of age and older,  Anyone 2 through 48 years of age with certain long-term health problems,  Anyone 2 through 48 years of age with a weakened immune system,  Adults 90 through 48 years of age who smoke cigarettes or have asthma. Most people need only one dose of PPSV. A second dose is recommended for certain high-risk groups. People 50 and older should get a dose even if they have gotten one or more doses of the vaccine before they turned 65. Your healthcare provider can give you more information about these recommendations. Most healthy adults develop protection within 2 to 3 weeks of getting the shot. 3. Some people should not get this vaccine  Anyone who has had a life-threatening allergic reaction to PPSV should not get another dose.  Anyone who has a severe allergy to any component of PPSV should not receive it. Tell your provider if you have any severe allergies.  Anyone who is moderately or severely ill when the shot is scheduled may be asked to wait until they recover before getting the vaccine. Someone with a mild illness can usually be vaccinated.  Children less than 20 years of age should not receive this vaccine.  There is no evidence that PPSV is harmful to either a pregnant woman or to her fetus. However, as a precaution, women who need the vaccine should be vaccinated before becoming pregnant, if possible. 4. Risks of a vaccine reaction With any medicine, including vaccines, there is a chance of side effects. These are usually mild and go away on their own, but serious reactions are also possible. About half of people who get PPSV have mild side effects, such as redness or pain where the shot is given, which go away within about two days. Less than 1 out of 100 people develop a fever, muscle aches, or more severe local reactions. Problems that could happen after any vaccine:  People sometimes faint after  a medical procedure, including vaccination. Sitting or lying down for about 15 minutes can help prevent fainting, and injuries caused by a fall. Tell your doctor if you feel dizzy, or have vision changes or ringing in the ears.  Some people get severe pain in the shoulder and have difficulty moving the arm where a shot was given. This happens very rarely.  Any medication can cause a severe allergic reaction. Such reactions from a vaccine are very rare, estimated at about 1 in a million doses, and would happen within a few minutes to a few hours after the vaccination. As with any medicine, there is a very remote chance of a vaccine causing a serious injury or death. The safety of vaccines is always being monitored. For more information, visit: http://floyd.org/ 5. What if there is a serious reaction? What should I look for? Look for anything that concerns you, such as signs of a severe allergic reaction, very high fever, or unusual behavior.  Signs of a severe allergic reaction can include hives, swelling of the face and throat, difficulty breathing, a fast heartbeat, dizziness, and weakness. These would usually start a few minutes to a few hours after the  vaccination. What should I do? If you think it is a severe allergic reaction or other emergency that can't wait, call 9-1-1 or get to the nearest hospital. Otherwise, call your doctor. Afterward, the reaction should be reported to the Vaccine Adverse Event Reporting System (VAERS). Your doctor might file this report, or you can do it yourself through the VAERS web site at www.vaers.LAgents.no, or by calling 1-(804)878-8279.  VAERS does not give medical advice. 6. How can I learn more?  Ask your doctor. He or she can give you the vaccine package insert or suggest other sources of information.  Call your local or state health department.  Contact the Centers for Disease Control and Prevention (CDC):  Call 6700856802 (1-800-CDC-INFO)  or  Visit CDC's website at PicCapture.uy CDC Pneumococcal Polysaccharide Vaccine VIS (05/23/13)   This information is not intended to replace advice given to you by your health care provider. Make sure you discuss any questions you have with your health care provider.   Document Released: 11/13/2005 Document Revised: 02/06/2014 Document Reviewed: 05/26/2013 Elsevier Interactive Patient Education 2016 Elsevier Inc.  Acute Bronchitis Bronchitis is inflammation of the airways that extend from the windpipe into the lungs (bronchi). The inflammation often causes mucus to develop. This leads to a cough, which is the most common symptom of bronchitis.  In acute bronchitis, the condition usually develops suddenly and goes away over time, usually in a couple weeks. Smoking, allergies, and asthma can make bronchitis worse. Repeated episodes of bronchitis may cause further lung problems.  CAUSES Acute bronchitis is most often caused by the same virus that causes a cold. The virus can spread from person to person (contagious) through coughing, sneezing, and touching contaminated objects. SIGNS AND SYMPTOMS   Cough.   Fever.   Coughing up mucus.   Body aches.   Chest congestion.   Chills.   Shortness of breath.   Sore throat.  DIAGNOSIS  Acute bronchitis is usually diagnosed through a physical exam. Your health care provider will also ask you questions about your medical history. Tests, such as chest X-rays, are sometimes done to rule out other conditions.  TREATMENT  Acute bronchitis usually goes away in a couple weeks. Oftentimes, no medical treatment is necessary. Medicines are sometimes given for relief of fever or cough. Antibiotic medicines are usually not needed but may be prescribed in certain situations. In some cases, an inhaler may be recommended to help reduce shortness of breath and control the cough. A cool mist vaporizer may also be used to help thin bronchial  secretions and make it easier to clear the chest.  HOME CARE INSTRUCTIONS  Get plenty of rest.   Drink enough fluids to keep your urine clear or pale yellow (unless you have a medical condition that requires fluid restriction). Increasing fluids may help thin your respiratory secretions (sputum) and reduce chest congestion, and it will prevent dehydration.   Take medicines only as directed by your health care provider.  If you were prescribed an antibiotic medicine, finish it all even if you start to feel better.  Avoid smoking and secondhand smoke. Exposure to cigarette smoke or irritating chemicals will make bronchitis worse. If you are a smoker, consider using nicotine gum or skin patches to help control withdrawal symptoms. Quitting smoking will help your lungs heal faster.   Reduce the chances of another bout of acute bronchitis by washing your hands frequently, avoiding people with cold symptoms, and trying not to touch your hands to your mouth, nose,  or eyes.   Keep all follow-up visits as directed by your health care provider.  SEEK MEDICAL CARE IF: Your symptoms do not improve after 1 week of treatment.  SEEK IMMEDIATE MEDICAL CARE IF:  You develop an increased fever or chills.   You have chest pain.   You have severe shortness of breath.  You have bloody sputum.   You develop dehydration.  You faint or repeatedly feel like you are going to pass out.  You develop repeated vomiting.  You develop a severe headache. MAKE SURE YOU:   Understand these instructions.  Will watch your condition.  Will get help right away if you are not doing well or get worse.   This information is not intended to replace advice given to you by your health care provider. Make sure you discuss any questions you have with your health care provider.   Document Released: 02/24/2004 Document Revised: 02/06/2014 Document Reviewed: 07/09/2012 Elsevier Interactive Patient Education  [employer]

## 2015-11-26 NOTE — Progress Notes (Signed)
Pt is in the office today for a cough Pt states his back is hurting Pt states his pain level is between 4-5 Pt states hismcough has been going on for 2-3 weeks  Pt states he is unable to sleep Pt states he cough all day and night

## 2015-11-26 NOTE — Progress Notes (Signed)
Fred Brown, is a 50 y.o. male  GQB:[id number]  EKC:[id number]  DOB - 04-Feb-1965  Subjective:  Chief Complaint and HPI: Fred Brown is a 50 y.o. male here today for a 3 week history of persistent cough productive of yellow phlegm. He denies f/c/hemoptysis.  He has not had night sweats.  He has lost a few pounds but has been watching his diet and exercising to bring his blood sugar down.  Last lab work was done in 08/2015 and reviewed today and was WNL.  He denies s/sx hyper/hypoglycemia or elevated BP.  Translating services refused.  Daughter translates for patient.   ROS:   Constitutional:  No f/c, No night sweats, No unexplained weight loss. EENT:  No vision changes, No blurry vision, No hearing changes. No mouth, throat, or ear problems.  Respiratory: + cough, No SOB Cardiac: No CP, no palpitations GI:  No abd pain, No N/V/D. GU: No Urinary s/sx Musculoskeletal: No joint pain Neuro: No headache, no dizziness, no motor weakness.  Skin: No rash Endocrine:  No polydipsia. No polyuria.  Psych: Denies SI/HI  No problems updated.  ALLERGIES: No Known Allergies  PAST MEDICAL HISTORY: No past medical history on file.  MEDICATIONS AT HOME: Prior to Admission medications   Medication Sig Start Date End Date Taking? Authorizing Provider  aspirin 325 MG tablet Take 1 tablet (325 mg total) by mouth daily. 06/21/15  Yes Arnoldo Morale, MD  atorvastatin (LIPITOR) 20 MG tablet Take 1 tablet (20 mg total) by mouth daily at 6 PM. 09/27/15  Yes Arnoldo Morale, MD  Blood Glucose Monitoring Suppl (ONE TOUCH ULTRA 2) w/Device KIT Use as directed 10/05/15  Yes Arnoldo Morale, MD  carvedilol (COREG) 6.25 MG tablet Take 1 tablet (6.25 mg total) by mouth 2 (two) times daily with a meal. 09/27/15  Yes Arnoldo Morale, MD  cetirizine (ZYRTEC) 10 MG tablet Take 1 tablet (10 mg total) by mouth daily. 09/27/15  Yes Arnoldo Morale, MD  glucose blood (ONE TOUCH ULTRA TEST) test strip Use as instructed 09/27/15  Yes Arnoldo Morale, MD  Lancets (ONETOUCH ULTRASOFT) lancets Use as instructed 10/05/15  Yes Arnoldo Morale, MD  lisinopril (PRINIVIL,ZESTRIL) 10 MG tablet Take 1 tablet (10 mg total) by mouth daily. 09/27/15  Yes Arnoldo Morale, MD  azithromycin (ZITHROMAX) 250 MG tablet Take 2 tabs today, then 1 tab daily 11/26/15   Argentina Donovan, PA-C  benzonatate (TESSALON) 200 MG capsule Take 1 capsule (200 mg total) by mouth 2 (two) times daily as needed for cough. 11/26/15   Argentina Donovan, PA-C     Objective:  EXAM:   Vitals:   11/26/15 1106  BP: 133/88  Pulse: 66  Resp: 16  Temp: 98 F (36.7 C)  TempSrc: Oral  SpO2: 99%  Weight: 154 lb 12.8 oz (70.2 kg)    General appearance : A&OX3. NAD. Non-toxic-appearing HEENT: Atraumatic and Normocephalic.  PERRLA. EOM intact.  TM clear B. Mouth-MMM, post pharynx WNL w/o erythema, No PND. Neck: supple, no JVD. No cervical lymphadenopathy. No thyromegaly Chest/Lungs:  Breathing-non-labored, Good air entry bilaterally, breath sounds normal without rales, rhonchi, or wheezing  CVS: S1 S2 regular, no murmurs, gallops, rubs  Extremities: Bilateral Lower Ext shows no edema, both legs are warm to touch with = pulse throughout Neurology:  CN II-XII grossly intact, Non focal.   Psych:  TP linear. J/I WNL. Normal speech. Appropriate eye contact and affect.  Skin:  No Rash  Data Review Lab Results  Component Value  Date   HGBA1C 5.9 11/26/2015   HGBA1C 6.5 (H) 06/24/2015   HGBA1C 6.2 (H) 02/26/2015     Assessment & Plan   1. Acute bronchitis, unspecified organism Cover for atypicals due to length of cough- Zpack, tessalon perles, resp care, fluids, rest.   2. Type 2 diabetes mellitus with hyperosmolarity without coma, unspecified long term insulin use status (HCC) Improved!! - HgB A1c - Glucose (CBG) Continue with current regimen of exercise and diet.  3. Essential hypertension Fairly well controlled.  Continue current regimen  Patient have been counseled  extensively about nutrition and exercise  Return in about 3 months (around 02/26/2016) for f/up Dr Jarold Song DM and htn.  The patient was given clear instructions to go to ER or return to medical center if symptoms don't improve, worsen or new problems develop. The patient verbalized understanding. The patient was told to call to get lab results if they haven't heard anything in the next week.     Freeman Caldron, PA-C [facility] [place], [place]   11/26/2015, 11:32 AM

## 2016-02-21 ENCOUNTER — Other Ambulatory Visit: Payer: Self-pay | Admitting: *Deleted

## 2016-02-21 DIAGNOSIS — I77810 Thoracic aortic ectasia: Secondary | ICD-10-CM

## 2016-02-23 ENCOUNTER — Encounter: Payer: Self-pay | Admitting: Family Medicine

## 2016-02-23 ENCOUNTER — Ambulatory Visit: Payer: Self-pay | Attending: Family Medicine | Admitting: Family Medicine

## 2016-02-23 VITALS — BP 137/82 | HR 62 | Temp 98.4°F | Ht 64.25 in | Wt 159.0 lb

## 2016-02-23 DIAGNOSIS — R059 Cough, unspecified: Secondary | ICD-10-CM

## 2016-02-23 DIAGNOSIS — E7800 Pure hypercholesterolemia, unspecified: Secondary | ICD-10-CM | POA: Insufficient documentation

## 2016-02-23 DIAGNOSIS — Z79899 Other long term (current) drug therapy: Secondary | ICD-10-CM | POA: Insufficient documentation

## 2016-02-23 DIAGNOSIS — Z8673 Personal history of transient ischemic attack (TIA), and cerebral infarction without residual deficits: Secondary | ICD-10-CM | POA: Insufficient documentation

## 2016-02-23 DIAGNOSIS — I77810 Thoracic aortic ectasia: Secondary | ICD-10-CM

## 2016-02-23 DIAGNOSIS — I10 Essential (primary) hypertension: Secondary | ICD-10-CM | POA: Insufficient documentation

## 2016-02-23 DIAGNOSIS — I712 Thoracic aortic aneurysm, without rupture: Secondary | ICD-10-CM | POA: Insufficient documentation

## 2016-02-23 DIAGNOSIS — E1100 Type 2 diabetes mellitus with hyperosmolarity without nonketotic hyperglycemic-hyperosmolar coma (NKHHC): Secondary | ICD-10-CM | POA: Insufficient documentation

## 2016-02-23 DIAGNOSIS — Z7982 Long term (current) use of aspirin: Secondary | ICD-10-CM | POA: Insufficient documentation

## 2016-02-23 DIAGNOSIS — R05 Cough: Secondary | ICD-10-CM | POA: Insufficient documentation

## 2016-02-23 LAB — GLUCOSE, POCT (MANUAL RESULT ENTRY): POC Glucose: 150 mg/dl — AB (ref 70–99)

## 2016-02-23 MED ORDER — CARVEDILOL 6.25 MG PO TABS: 6.2500 mg | ORAL_TABLET | Freq: Two times a day (BID) | ORAL | 3 refills | Status: DC

## 2016-02-23 MED ORDER — LOSARTAN POTASSIUM 50 MG PO TABS: 50.0000 mg | ORAL_TABLET | Freq: Every day | ORAL | 3 refills | Status: DC

## 2016-02-23 MED ORDER — ATORVASTATIN CALCIUM 20 MG PO TABS: 20.0000 mg | ORAL_TABLET | Freq: Every day | ORAL | 3 refills | Status: DC

## 2016-02-23 MED FILL — CARVEDILOL 6.25 MG TABLET: 6.25 | 30 days supply | Qty: 60 | Fill #0

## 2016-02-23 MED FILL — ATORVASTATIN 20 MG TABLET: 20 | 30 days supply | Qty: 30 | Fill #0

## 2016-02-23 MED FILL — LOSARTAN POTASSIUM 50 MG TA: 50 | 30 days supply | Qty: 30 | Fill #0

## 2016-02-23 NOTE — Progress Notes (Signed)
Subjective:  Patient ID: Fred Brown, male    DOB: September 27, 1965  Age: 51 y.o. MRN: [id number]  CC: Diabetes and Hypertension   HPI Fred Brown is a 51 year old man with a history of HTN, hyperlipidemia, Type 2 DM (A1c 5.9- Diet controlled), TIA, ascending aortic aneurysm who comes into the clinic for follow-up visit.  He had complained of cough at his last office visit and was placed on Zyrtec and Tessalon perles and symptoms improved slightly only to return once he completed Tessalon perles. Cough is nonproductive and he denies nasal congestion, chest pains, shortness of breath or wheezing.  He reports compliance with medications, low-sodium diet and exercise and has not been out of any of his medications. There are no surgical recommendations as per cardiothoracic surgery made at this time for his ascending aortic dilatation but he is scheduled for 1 year follow-up - his last visit was in 03/2015 with Dr Roxan Hockey  History reviewed. No pertinent past medical history.  History reviewed. No pertinent surgical history.  No Known Allergies   Outpatient Medications Prior to Visit  Medication Sig Dispense Refill  . aspirin 325 MG tablet Take 1 tablet (325 mg total) by mouth daily. 30 tablet 3  . atorvastatin (LIPITOR) 20 MG tablet Take 1 tablet (20 mg total) by mouth daily at 6 PM. 30 tablet 3  . carvedilol (COREG) 6.25 MG tablet Take 1 tablet (6.25 mg total) by mouth 2 (two) times daily with a meal. 60 tablet 3  . lisinopril (PRINIVIL,ZESTRIL) 10 MG tablet Take 1 tablet (10 mg total) by mouth daily. 30 tablet 3  . Blood Glucose Monitoring Suppl (ONE TOUCH ULTRA 2) w/Device KIT Use as directed (Patient not taking: Reported on 02/23/2016) 1 each 0  . cetirizine (ZYRTEC) 10 MG tablet Take 1 tablet (10 mg total) by mouth daily. 30 tablet 2  . glucose blood (ONE TOUCH ULTRA TEST) test strip Use as instructed (Patient not taking: Reported on 02/23/2016) 30 each 5  . Lancets (ONETOUCH ULTRASOFT)  lancets Use as instructed (Patient not taking: Reported on 02/23/2016) 100 each 12  . azithromycin (ZITHROMAX) 250 MG tablet Take 2 tabs today, then 1 tab daily 6 tablet 0  . benzonatate (TESSALON) 200 MG capsule Take 1 capsule (200 mg total) by mouth 2 (two) times daily as needed for cough. (Patient not taking: Reported on 02/23/2016) 20 capsule 0   No facility-administered medications prior to visit.     ROS Review of Systems  Constitutional: Negative for activity change and appetite change.  HENT: Negative for sinus pressure and sore throat.   Eyes: Negative for visual disturbance.  Respiratory: Positive for cough. Negative for chest tightness and shortness of breath.   Cardiovascular: Negative for chest pain and leg swelling.  Gastrointestinal: Negative for abdominal distention, abdominal pain, constipation and diarrhea.  Endocrine: Negative.   Genitourinary: Negative for dysuria.  Musculoskeletal: Negative for joint swelling and myalgias.  Skin: Negative for rash.  Allergic/Immunologic: Negative.   Neurological: Negative for weakness, light-headedness and numbness.  Psychiatric/Behavioral: Negative for dysphoric mood and suicidal ideas.    Objective:  BP 137/82 (BP Location: Right Arm, Patient Position: Sitting, Cuff Size: Small)   Pulse 62   Temp 98.4 F (36.9 C) (Oral)   Ht 5' 4.25" (1.632 m)   Wt 159 lb (72.1 kg)   SpO2 98%   BMI 27.08 kg/m   BP/Weight 02/23/2016 11/26/2015 9/32/6712  Systolic BP 458 099 833  Diastolic BP 82 88 76  Wt. (Lbs) 159 154.8 157  BMI 27.08 24.99 25.34      Physical Exam  Constitutional: He is oriented to person, place, and time. He appears well-developed and well-nourished.  Cardiovascular: Normal rate, normal heart sounds and intact distal pulses.   No murmur heard. Pulmonary/Chest: Effort normal and breath sounds normal. He has no wheezes. He has no rales. He exhibits no tenderness.  Abdominal: Soft. Bowel sounds are normal. He  exhibits no distension and no mass. There is no tenderness.  Musculoskeletal: Normal range of motion.  Neurological: He is alert and oriented to person, place, and time.     Lab Results  Component Value Date   HGBA1C 5.9 11/26/2015    CMP Latest Ref Rng & Units 09/27/2015 06/24/2015 02/27/2015  Glucose 65 - 99 mg/dL 112(H) 126(H) 121(H)  BUN 7 - 25 mg/dL '20 18 19  ' Creatinine 0.60 - 1.35 mg/dL 0.91 1.04 0.94  Sodium 135 - 146 mmol/L 139 135 139  Potassium 3.5 - 5.3 mmol/L 4.3 3.0(L) 3.8  Chloride 98 - 110 mmol/L 105 93(L) 106  CO2 20 - 31 mmol/L '22 30 24  ' Calcium 8.6 - 10.3 mg/dL 9.3 9.5 9.1  Total Protein 6.1 - 8.1 g/dL 7.7 8.3(H) -  Total Bilirubin 0.2 - 1.2 mg/dL 0.7 1.0 -  Alkaline Phos 40 - 115 U/L 72 84 -  AST 10 - 40 U/L 14 17 -  ALT 9 - 46 U/L 19 16 -    Lipid Panel     Component Value Date/Time   CHOL 142 06/24/2015 0850   TRIG 97 06/24/2015 0850   HDL 43 06/24/2015 0850   CHOLHDL 3.3 06/24/2015 0850   VLDL 19 06/24/2015 0850   LDLCALC 80 06/24/2015 0850     Assessment & Plan:   1. Type 2 diabetes mellitus with hyperosmolarity without coma, without long-term current use of insulin (HCC) Controlled with A1c of 5.9 Diet-controlled - Glucose (CBG)  2. Pure hypercholesterolemia Controlled Low-cholesterol diet - atorvastatin (LIPITOR) 20 MG tablet; Take 1 tablet (20 mg total) by mouth daily at 6 PM.  Dispense: 30 tablet; Refill: 3  3. Essential hypertension Controlled Substitute lisinopril with losartan due to complain of cough Low-sodium diet - carvedilol (COREG) 6.25 MG tablet; Take 1 tablet (6.25 mg total) by mouth 2 (two) times daily with a meal.  Dispense: 60 tablet; Refill: 3 - losartan (COZAAR) 50 MG tablet; Take 1 tablet (50 mg total) by mouth daily.  Dispense: 30 tablet; Refill: 3  4. Cough Likely ACE inhibitor induced as this has not responded to use of antihistamine  5. Aortic aneurysm Mild aneurysmal dilatation of the ascending aorta at 4.2 cm  from CTA of 01/2015 Scheduled for follow-up with cardiac surgeon which comes up next month.  CTA chest ordered by cardiac surgeon .  Meds ordered this encounter  Medications  . atorvastatin (LIPITOR) 20 MG tablet    Sig: Take 1 tablet (20 mg total) by mouth daily at 6 PM.    Dispense:  30 tablet    Refill:  3  . carvedilol (COREG) 6.25 MG tablet    Sig: Take 1 tablet (6.25 mg total) by mouth 2 (two) times daily with a meal.    Dispense:  60 tablet    Refill:  3  . losartan (COZAAR) 50 MG tablet    Sig: Take 1 tablet (50 mg total) by mouth daily.    Dispense:  30 tablet    Refill:  3  Discontinue lisinopril    Follow-up: Return in about 3 months (around 05/23/2016) for follow up on diabetes mellitus.   Arnoldo Morale MD

## 2016-03-24 MED FILL — ?LOSARTAN POTASSIUM 50 MG T: 50 MG | 30 days supply | Qty: 30 | Fill #1

## 2016-03-28 ENCOUNTER — Other Ambulatory Visit: Payer: Self-pay

## 2016-03-28 ENCOUNTER — Ambulatory Visit: Payer: Self-pay | Admitting: Thoracic Surgery (Cardiothoracic Vascular Surgery)

## 2016-03-28 MED FILL — CARVEDILOL 6.25 MG TABLET: 6.25 | 30 days supply | Qty: 60 | Fill #1

## 2016-03-28 MED FILL — ATORVASTATIN 20 MG TABLET: 20 | 30 days supply | Qty: 30 | Fill #1

## 2016-04-25 ENCOUNTER — Ambulatory Visit: Payer: Self-pay | Admitting: Thoracic Surgery (Cardiothoracic Vascular Surgery)

## 2016-04-25 ENCOUNTER — Other Ambulatory Visit: Payer: Self-pay

## 2016-05-01 MED FILL — CARVEDILOL 6.25 MG TABLET: 6.25 | 30 days supply | Qty: 60 | Fill #2

## 2016-05-01 MED FILL — ?LOSARTAN POTASSIUM 50 MG T: 50 MG | 30 days supply | Qty: 30 | Fill #2

## 2016-05-01 MED FILL — ATORVASTATIN 20 MG TABLET: 20 | 30 days supply | Qty: 30 | Fill #2

## 2016-05-10 ENCOUNTER — Other Ambulatory Visit: Payer: Self-pay | Admitting: *Deleted

## 2016-05-16 ENCOUNTER — Inpatient Hospital Stay: Admission: RE | Admit: 2016-05-16 | Payer: Self-pay | Source: Ambulatory Visit

## 2016-05-16 ENCOUNTER — Ambulatory Visit: Payer: Self-pay | Admitting: Thoracic Surgery (Cardiothoracic Vascular Surgery)

## 2016-05-30 ENCOUNTER — Encounter: Payer: Self-pay | Admitting: Family Medicine

## 2016-05-30 ENCOUNTER — Ambulatory Visit: Payer: Self-pay | Attending: Family Medicine | Admitting: Family Medicine

## 2016-05-30 VITALS — BP 125/85 | HR 72 | Temp 98.1°F | Resp 16 | Ht 63.0 in | Wt 165.0 lb

## 2016-05-30 DIAGNOSIS — I712 Thoracic aortic aneurysm, without rupture: Secondary | ICD-10-CM | POA: Insufficient documentation

## 2016-05-30 DIAGNOSIS — E7800 Pure hypercholesterolemia, unspecified: Secondary | ICD-10-CM | POA: Insufficient documentation

## 2016-05-30 DIAGNOSIS — K2100 Gastro-esophageal reflux disease with esophagitis, without bleeding: Secondary | ICD-10-CM

## 2016-05-30 DIAGNOSIS — Z7982 Long term (current) use of aspirin: Secondary | ICD-10-CM | POA: Insufficient documentation

## 2016-05-30 DIAGNOSIS — Z1159 Encounter for screening for other viral diseases: Secondary | ICD-10-CM

## 2016-05-30 DIAGNOSIS — I10 Essential (primary) hypertension: Secondary | ICD-10-CM | POA: Insufficient documentation

## 2016-05-30 DIAGNOSIS — K219 Gastro-esophageal reflux disease without esophagitis: Secondary | ICD-10-CM | POA: Insufficient documentation

## 2016-05-30 DIAGNOSIS — Z79899 Other long term (current) drug therapy: Secondary | ICD-10-CM | POA: Insufficient documentation

## 2016-05-30 DIAGNOSIS — Z8673 Personal history of transient ischemic attack (TIA), and cerebral infarction without residual deficits: Secondary | ICD-10-CM | POA: Insufficient documentation

## 2016-05-30 DIAGNOSIS — I77810 Thoracic aortic ectasia: Secondary | ICD-10-CM

## 2016-05-30 DIAGNOSIS — E119 Type 2 diabetes mellitus without complications: Secondary | ICD-10-CM | POA: Insufficient documentation

## 2016-05-30 DIAGNOSIS — E785 Hyperlipidemia, unspecified: Secondary | ICD-10-CM | POA: Insufficient documentation

## 2016-05-30 LAB — GLUCOSE, POCT (MANUAL RESULT ENTRY): POC Glucose: 120 mg/dl — AB (ref 70–99)

## 2016-05-30 LAB — POCT GLYCOSYLATED HEMOGLOBIN (HGB A1C): Hemoglobin A1C: 6.4

## 2016-05-30 MED ORDER — LOSARTAN POTASSIUM 50 MG PO TABS: 50.0000 mg | ORAL_TABLET | Freq: Every day | ORAL | 5 refills | Status: DC

## 2016-05-30 MED ORDER — ATORVASTATIN CALCIUM 20 MG PO TABS: 20.0000 mg | ORAL_TABLET | Freq: Every day | ORAL | 5 refills | Status: DC

## 2016-05-30 MED ORDER — OMEPRAZOLE 20 MG PO CPDR: 20.0000 mg | DELAYED_RELEASE_CAPSULE | Freq: Every day | ORAL | 5 refills | Status: DC

## 2016-05-30 MED ORDER — CARVEDILOL 6.25 MG PO TABS: 6.2500 mg | ORAL_TABLET | Freq: Two times a day (BID) | ORAL | 5 refills | Status: DC

## 2016-05-30 MED FILL — LOSARTAN POTASSIUM 50 MG TA: 50 | 30 days supply | Qty: 30 | Fill #0

## 2016-05-30 MED FILL — ATORVASTATIN 20 MG TABLET: 20 | 30 days supply | Qty: 30 | Fill #0

## 2016-05-30 MED FILL — ?OMEPRAZOLE DR 20 MG CAPSUL: 20 | 30 days supply | Qty: 30 | Fill #0

## 2016-05-30 MED FILL — ?CARVEDILOL 6.25 MG TABLET: 6.25 | 30 days supply | Qty: 60 | Fill #0

## 2016-05-30 NOTE — Progress Notes (Signed)
Subjective:  Patient ID: Fred Brown, male    DOB: Jan 08, 1966  Age: 51 y.o. MRN: [id number]  CC: Follow-up on diabetes  HPI Fred Brown is a 51 year old man with a history of HTN, hyperlipidemia, Type 2 DM (A1c 6.4- Diet controlled), TIA, ascending aortic aneurysm who comes into the clinic for follow-up visit.  He complains of dyspepsia which is worse after meals and associated with intake of coffee. Denies nausea or vomiting and has no diarrhea or constipation. Denies hematochezia or melena.  He reports compliance with medications, low-sodium diet and exercise and has not been out of any of his medications.  There are no surgical recommendations as per cardiothoracic surgery made at this time for his ascending aortic dilatation but he is scheduled for 1 year follow-up - his last visit was in 03/2015 with Dr Roxan Hockey. He is yet to see cardiothoracic surgery.  No past medical history on file.  No past surgical history on file.   Outpatient Medications Prior to Visit  Medication Sig Dispense Refill  . aspirin 325 MG tablet Take 1 tablet (325 mg total) by mouth daily. 30 tablet 3  . Blood Glucose Monitoring Suppl (ONE TOUCH ULTRA 2) w/Device KIT Use as directed 1 each 0  . glucose blood (ONE TOUCH ULTRA TEST) test strip Use as instructed 30 each 5  . Lancets (ONETOUCH ULTRASOFT) lancets Use as instructed 100 each 12  . atorvastatin (LIPITOR) 20 MG tablet Take 1 tablet (20 mg total) by mouth daily at 6 PM. 30 tablet 3  . carvedilol (COREG) 6.25 MG tablet Take 1 tablet (6.25 mg total) by mouth 2 (two) times daily with a meal. 60 tablet 3  . losartan (COZAAR) 50 MG tablet Take 1 tablet (50 mg total) by mouth daily. 30 tablet 3  . cetirizine (ZYRTEC) 10 MG tablet Take 1 tablet (10 mg total) by mouth daily. (Patient not taking: Reported on 05/30/2016) 30 tablet 2   No facility-administered medications prior to visit.     ROS Review of Systems  Constitutional: Negative for activity change and  appetite change.  HENT: Negative for sinus pressure and sore throat.   Eyes: Negative for visual disturbance.  Respiratory: Negative for cough, chest tightness and shortness of breath.   Cardiovascular: Negative for chest pain and leg swelling.  Gastrointestinal: Negative for abdominal distention, abdominal pain, constipation and diarrhea.       Positive for reflux  Endocrine: Negative.   Genitourinary: Negative for dysuria.  Musculoskeletal: Negative for joint swelling and myalgias.  Skin: Negative for rash.  Allergic/Immunologic: Negative.   Neurological: Negative for weakness, light-headedness and numbness.  Psychiatric/Behavioral: Negative for dysphoric mood and suicidal ideas.    Objective:  BP 125/85 (BP Location: Left Arm, Patient Position: Sitting, Cuff Size: Normal)   Pulse 72   Temp 98.1 F (36.7 C) (Oral)   Resp 16   Ht _0  (1.6 m)   Wt 165 lb (74.8 kg)   SpO2 100%   BMI 29.23 kg/m   BP/Weight 05/30/2016 02/23/2016 48/18/5631  Systolic BP 497 026 378  Diastolic BP 85 82 88  Wt. (Lbs) 165 159 154.8  BMI 29.23 27.08 24.99      Physical Exam  Constitutional: He is oriented to person, place, and time. He appears well-developed and well-nourished.  Cardiovascular: Normal rate, normal heart sounds and intact distal pulses.   No murmur heard. Pulmonary/Chest: Effort normal and breath sounds normal. He has no wheezes. He has no rales. He exhibits  no tenderness.  Abdominal: Soft. Bowel sounds are normal. He exhibits no distension and no mass. There is no tenderness.  Musculoskeletal: Normal range of motion.  Neurological: He is alert and oriented to person, place, and time.  Skin: Skin is warm and dry.  Psychiatric: He has a normal mood and affect.     Lab Results  Component Value Date   HGBA1C 6.4 05/30/2016    CMP Latest Ref Rng & Units 09/27/2015 06/24/2015 02/27/2015  Glucose 65 - 99 mg/dL 112(H) 126(H) 121(H)  BUN 7 - 25 mg/dL _0 Creatinine 0.60 -  1.35 mg/dL 0.91 1.04 0.94  Sodium 135 - 146 mmol/L 139 135 139  Potassium 3.5 - 5.3 mmol/L 4.3 3.0(L) 3.8  Chloride 98 - 110 mmol/L 105 93(L) 106  CO2 20 - 31 mmol/L _1 Calcium 8.6 - 10.3 mg/dL 9.3 9.5 9.1  Total Protein 6.1 - 8.1 g/dL 7.7 8.3(H) -  Total Bilirubin 0.2 - 1.2 mg/dL 0.7 1.0 -  Alkaline Phos 40 - 115 U/L 72 84 -  AST 10 - 40 U/L 14 17 -  ALT 9 - 46 U/L 19 16 -    Lipid Panel     Component Value Date/Time   CHOL 142 06/24/2015 0850   TRIG 97 06/24/2015 0850   HDL 43 06/24/2015 0850   CHOLHDL 3.3 06/24/2015 0850   VLDL 19 06/24/2015 0850   LDLCALC 80 06/24/2015 0850    Assessment & Plan:   1. Type 2 diabetes mellitus without complication, without long-term current use of insulin (HCC) Diet Controlled with A1c of 6.4 Might consider initiation of metformin if A1c trends up. - POCT glycosylated hemoglobin (Hb A1C) - POCT glucose (manual entry) - CMP14+EGFR - Lipid panel - Microalbumin/Creatinine Ratio, Urine  2. Essential hypertension Controlled Low-sodium diet - losartan (COZAAR) 50 MG tablet; Take 1 tablet (50 mg total) by mouth daily.  Dispense: 30 tablet; Refill: 5 - carvedilol (COREG) 6.25 MG tablet; Take 1 tablet (6.25 mg total) by mouth 2 (two) times daily with a meal.  Dispense: 60 tablet; Refill: 5  3. Pure hypercholesterolemia Controlled Low-cholesterol - atorvastatin (LIPITOR) 20 MG tablet; Take 1 tablet (20 mg total) by mouth daily at 6 PM.  Dispense: 30 tablet; Refill: 5  4. Gastroesophageal reflux disease with esophagitis Advised against late meals Avoid recumbent position for up to 2 hours following a meal - omeprazole (PRILOSEC) 20 MG capsule; Take 1 capsule (20 mg total) by mouth daily.  Dispense: 30 capsule; Refill: 5  5. Screening for viral disease - HIV antibody (with reflex)  6. Aortic aneurysm Scheduled for CTA of the chest Patient to schedule an appointment with his cardiac surgeon.  Meds ordered this encounter    Medications  . losartan (COZAAR) 50 MG tablet    Sig: Take 1 tablet (50 mg total) by mouth daily.    Dispense:  30 tablet    Refill:  5  . carvedilol (COREG) 6.25 MG tablet    Sig: Take 1 tablet (6.25 mg total) by mouth 2 (two) times daily with a meal.    Dispense:  60 tablet    Refill:  5  . atorvastatin (LIPITOR) 20 MG tablet    Sig: Take 1 tablet (20 mg total) by mouth daily at 6 PM.    Dispense:  30 tablet    Refill:  5  . omeprazole (PRILOSEC) 20 MG capsule    Sig: Take 1 capsule (20 mg total) by  mouth daily.    Dispense:  30 capsule    Refill:  5    Follow-up: Return in about 3 months (around 08/30/2016) for Follow-up on diabetes mellitus.   Arnoldo Morale MD

## 2016-05-30 NOTE — Progress Notes (Signed)
[facility]  F/U DM Pt fasting today  No pain today  No tobacco user  Glucose running average 125 No suicidal thoughts in the past no

## 2016-05-31 ENCOUNTER — Telehealth: Payer: Self-pay

## 2016-05-31 LAB — LIPID PANEL
Chol/HDL Ratio: 3.6 ratio (ref 0.0–5.0)
Cholesterol, Total: 139 mg/dL (ref 100–199)
HDL: 39 mg/dL — ABNORMAL LOW (ref >39)
LDL Calculated: 81 mg/dL (ref 0–99)
Triglycerides: 94 mg/dL (ref 0–149)
VLDL Cholesterol Cal: 19 mg/dL (ref 5–40)

## 2016-05-31 LAB — CMP14+EGFR
ALT: 28 IU/L (ref 0–44)
AST: 22 IU/L (ref 0–40)
Albumin/Globulin Ratio: 1.5 (ref 1.2–2.2)
Albumin: 4.6 g/dL (ref 3.5–5.5)
Alkaline Phosphatase: 80 IU/L (ref 39–117)
BUN/Creatinine Ratio: 16 (ref 9–20)
BUN: 15 mg/dL (ref 6–24)
Bilirubin Total: 0.8 mg/dL (ref 0.0–1.2)
CO2: 21 mmol/L (ref 18–29)
Calcium: 9.1 mg/dL (ref 8.7–10.2)
Chloride: 101 mmol/L (ref 96–106)
Creatinine, Ser: 0.93 mg/dL (ref 0.76–1.27)
GFR calc Af Amer: 111 mL/min/{1.73_m2} (ref >59)
GFR calc non Af Amer: 96 mL/min/{1.73_m2} (ref >59)
Globulin, Total: 3.1 g/dL (ref 1.5–4.5)
Glucose: 121 mg/dL — ABNORMAL HIGH (ref 65–99)
Potassium: 4.3 mmol/L (ref 3.5–5.2)
Sodium: 138 mmol/L (ref 134–144)
Total Protein: 7.7 g/dL (ref 6.0–8.5)

## 2016-05-31 LAB — MICROALBUMIN / CREATININE URINE RATIO
Creatinine, Urine: 247.9 mg/dL
Microalb/Creat Ratio: 1.9 mg/g creat (ref 0.0–30.0)
Microalbumin, Urine: 4.8 ug/mL

## 2016-05-31 LAB — HIV ANTIBODY (ROUTINE TESTING W REFLEX): HIV Screen 4th Generation wRfx: NONREACTIVE

## 2016-05-31 NOTE — Telephone Encounter (Signed)
Clld pt -  [facility] [employer] [facility] ID# [id number]   [facility] re lab results.

## 2016-05-31 NOTE — Telephone Encounter (Signed)
-----   Message from Jaclyn Shaggy, MD sent at 05/31/2016  4:44 PM EDT ----- Please inform the patient that labs are normal. Thank you.

## 2016-07-10 MED FILL — ?OMEPRAZOLE DR 20 MG CAPSUL: 20 | 30 days supply | Qty: 30 | Fill #1

## 2016-07-10 MED FILL — CARVEDILOL 6.25 MG TABLET: 6.25 | 30 days supply | Qty: 60 | Fill #1

## 2016-07-10 MED FILL — ATORVASTATIN 20 MG TABLET: 20 | 30 days supply | Qty: 30 | Fill #1

## 2016-07-10 MED FILL — LOSARTAN POTASSIUM 50 MG TA: 50 | 30 days supply | Qty: 30 | Fill #1

## 2016-08-14 MED FILL — ?CARVEDILOL 6.25 MG TABLET: 6.25 | 30 days supply | Qty: 60 | Fill #2

## 2016-08-14 MED FILL — ?ATORVASTATIN 20 MG TABLET: 20 | 30 days supply | Qty: 30 | Fill #2

## 2016-08-14 MED FILL — ?OMEPRAZOLE DR 20 MG CAPSUL: 20 | 30 days supply | Qty: 30 | Fill #2

## 2016-08-14 MED FILL — LOSARTAN POTASSIUM 50 MG TA: 50 | 30 days supply | Qty: 30 | Fill #2

## 2016-09-01 ENCOUNTER — Ambulatory Visit: Payer: Self-pay | Admitting: Family Medicine

## 2016-09-12 MED FILL — ?CARVEDILOL 6.25 MG TABLET: 6.25 | 30 days supply | Qty: 60 | Fill #3

## 2016-09-12 MED FILL — ?OMEPRAZOLE DR 20 MG CAPSUL: 20 | 30 days supply | Qty: 30 | Fill #3

## 2016-09-12 MED FILL — LOSARTAN POTASSIUM 50 MG TA: 50 | 30 days supply | Qty: 30 | Fill #3

## 2016-10-16 MED FILL — LOSARTAN POTASSIUM 50 MG TA: 50 | 30 days supply | Qty: 30 | Fill #4

## 2016-10-16 MED FILL — ?CARVEDILOL 6.25 MG TABLET: 6.25 | 30 days supply | Qty: 60 | Fill #4

## 2016-10-16 MED FILL — ?ATORVASTATIN 20 MG TABLET: 20 | 30 days supply | Qty: 30 | Fill #3

## 2016-10-16 MED FILL — ?OMEPRAZOLE DR 20 MG CAPSUL: 20 | 30 days supply | Qty: 30 | Fill #4

## 2016-11-10 ENCOUNTER — Ambulatory Visit: Payer: BLUE CROSS/BLUE SHIELD | Attending: Family Medicine | Admitting: Family Medicine

## 2016-11-10 ENCOUNTER — Encounter: Payer: Self-pay | Admitting: Family Medicine

## 2016-11-10 VITALS — BP 164/105 | HR 67 | Temp 97.6°F | Ht 63.0 in | Wt 159.0 lb

## 2016-11-10 DIAGNOSIS — I713 Abdominal aortic aneurysm, ruptured, unspecified: Secondary | ICD-10-CM

## 2016-11-10 DIAGNOSIS — E1100 Type 2 diabetes mellitus with hyperosmolarity without nonketotic hyperglycemic-hyperosmolar coma (NKHHC): Secondary | ICD-10-CM | POA: Diagnosis not present

## 2016-11-10 DIAGNOSIS — E7800 Pure hypercholesterolemia, unspecified: Secondary | ICD-10-CM | POA: Diagnosis not present

## 2016-11-10 DIAGNOSIS — I10 Essential (primary) hypertension: Secondary | ICD-10-CM | POA: Insufficient documentation

## 2016-11-10 DIAGNOSIS — E785 Hyperlipidemia, unspecified: Secondary | ICD-10-CM | POA: Diagnosis not present

## 2016-11-10 DIAGNOSIS — K2100 Gastro-esophageal reflux disease with esophagitis, without bleeding: Secondary | ICD-10-CM

## 2016-11-10 DIAGNOSIS — M542 Cervicalgia: Secondary | ICD-10-CM | POA: Insufficient documentation

## 2016-11-10 DIAGNOSIS — Z79899 Other long term (current) drug therapy: Secondary | ICD-10-CM | POA: Insufficient documentation

## 2016-11-10 DIAGNOSIS — E119 Type 2 diabetes mellitus without complications: Secondary | ICD-10-CM | POA: Diagnosis present

## 2016-11-10 DIAGNOSIS — Z7982 Long term (current) use of aspirin: Secondary | ICD-10-CM | POA: Diagnosis not present

## 2016-11-10 DIAGNOSIS — Z8673 Personal history of transient ischemic attack (TIA), and cerebral infarction without residual deficits: Secondary | ICD-10-CM | POA: Diagnosis not present

## 2016-11-10 DIAGNOSIS — I77810 Thoracic aortic ectasia: Secondary | ICD-10-CM | POA: Diagnosis not present

## 2016-11-10 DIAGNOSIS — H1013 Acute atopic conjunctivitis, bilateral: Secondary | ICD-10-CM | POA: Diagnosis not present

## 2016-11-10 DIAGNOSIS — H04203 Unspecified epiphora, bilateral lacrimal glands: Secondary | ICD-10-CM | POA: Diagnosis not present

## 2016-11-10 DIAGNOSIS — I712 Thoracic aortic aneurysm, without rupture: Secondary | ICD-10-CM | POA: Diagnosis not present

## 2016-11-10 LAB — POCT GLYCOSYLATED HEMOGLOBIN (HGB A1C): Hemoglobin A1C: 6.1

## 2016-11-10 LAB — GLUCOSE, POCT (MANUAL RESULT ENTRY): POC Glucose: 110 mg/dl — AB (ref 70–99)

## 2016-11-10 MED ORDER — LOSARTAN POTASSIUM 50 MG PO TABS: 50.0000 mg | ORAL_TABLET | Freq: Every day | ORAL | 5 refills | Status: DC

## 2016-11-10 MED ORDER — OLOPATADINE HCL 0.1 % OP SOLN: 1.0000 [drp] | Freq: Two times a day (BID) | OPHTHALMIC | 1 refills | Status: DC

## 2016-11-10 MED ORDER — CARVEDILOL 6.25 MG PO TABS: 6.2500 mg | ORAL_TABLET | Freq: Two times a day (BID) | ORAL | 5 refills | Status: DC

## 2016-11-10 MED ORDER — ATORVASTATIN CALCIUM 20 MG PO TABS: 20.0000 mg | ORAL_TABLET | Freq: Every day | ORAL | 5 refills | Status: DC

## 2016-11-10 MED ORDER — METHOCARBAMOL 500 MG PO TABS: 1000.0000 mg | ORAL_TABLET | Freq: Every evening | ORAL | 2 refills | Status: DC | PRN

## 2016-11-10 MED ORDER — OMEPRAZOLE 20 MG PO CPDR: 20.0000 mg | DELAYED_RELEASE_CAPSULE | Freq: Every day | ORAL | 5 refills | Status: DC

## 2016-11-10 MED FILL — METHOCARBAMOL 500 MG TABS: 500 | 30 days supply | Qty: 60 | Fill #0

## 2016-11-10 MED FILL — ?OMEPRAZOLE DR 20 MG CAPSUL: 20 | 30 days supply | Qty: 30 | Fill #0

## 2016-11-10 MED FILL — LOSARTAN POTASSIUM 50 MG TA: 50 | 30 days supply | Qty: 30 | Fill #0

## 2016-11-10 MED FILL — ?ATORVASTATIN 20 MG TABLET: 20 | 30 days supply | Qty: 30 | Fill #0

## 2016-11-10 MED FILL — ?CARVEDILOL 6.25 MG TABLET: 6.25 | 30 days supply | Qty: 60 | Fill #0

## 2016-11-10 NOTE — Progress Notes (Signed)
Subjective:  Patient ID: Fred Brown, male    DOB: September 01, 1965  Age: 51 y.o. MRN: [id number]  CC: Diabetes   HPI Fred Brown is a 51 year old man with a history of HTN, hyperlipidemia, Type 2 DM (A1c 6.1- Diet controlled), TIA, ascending aortic aneurysm who comes into the clinic for follow-up visit.  His diabetes has been controlled and he denies hypoglycemia or numbness in extremities and has no visual concerns. He has not had an eye exam this year.  He has not been to see his cardiac surgeon for follow-up of his aortic aneurysm which measured 4.4 cm from his CT angiogram of the  chest in 01/2015 but denies chest pains or shortness of breath.  He complains of tearing of his eyes which he states is chronic but denies associated itching, blurry vision or purulent discharge and he has no headaches.  He complains of a two to three-week history of posterior neck pain which is absent at this time and he describes this as intermittent and does not radiate. Denies numbness in extremities. Pain is aggravated by his job and he currently does not take any analgesics.  Past Medical History:  Diagnosis Date  . Ascending aorta dilatation (HCC) 02/25/2015  . Essential hypertension 02/25/2015  . History of TIA (transient ischemic attack) 03/05/2015  . Type 2 diabetes mellitus ([place]) 03/06/2015    No past surgical history on file.  No Known Allergies    Outpatient Medications Prior to Visit  Medication Sig Dispense Refill  . aspirin 325 MG tablet Take 1 tablet (325 mg total) by mouth daily. 30 tablet 3  . Blood Glucose Monitoring Suppl (ONE TOUCH ULTRA 2) w/Device KIT Use as directed 1 each 0  . glucose blood (ONE TOUCH ULTRA TEST) test strip Use as instructed 30 each 5  . Lancets (ONETOUCH ULTRASOFT) lancets Use as instructed 100 each 12  . atorvastatin (LIPITOR) 20 MG tablet Take 1 tablet (20 mg total) by mouth daily at 6 PM. 30 tablet 5  . carvedilol (COREG) 6.25 MG tablet Take 1 tablet (6.25 mg total)  by mouth 2 (two) times daily with a meal. 60 tablet 5  . losartan (COZAAR) 50 MG tablet Take 1 tablet (50 mg total) by mouth daily. 30 tablet 5  . omeprazole (PRILOSEC) 20 MG capsule Take 1 capsule (20 mg total) by mouth daily. 30 capsule 5   No facility-administered medications prior to visit.     ROS Review of Systems  Constitutional: Negative for activity change and appetite change.  HENT: Negative for sinus pressure and sore throat.   Eyes: Positive for discharge. Negative for visual disturbance.  Respiratory: Negative for cough, chest tightness and shortness of breath.   Cardiovascular: Negative for chest pain and leg swelling.  Gastrointestinal: Negative for abdominal distention, abdominal pain, constipation and diarrhea.  Endocrine: Negative.   Genitourinary: Negative for dysuria.  Musculoskeletal: Positive for neck pain. Negative for joint swelling and myalgias.  Skin: Negative for rash.  Allergic/Immunologic: Negative.   Neurological: Negative for weakness, light-headedness and numbness.  Psychiatric/Behavioral: Negative for dysphoric mood and suicidal ideas.     Objective:  BP (!) 164/105   Pulse 67   Temp 97.6 F (36.4 C) (Oral)   Ht '5\' 3"'  (1.6 m)   Wt 159 lb (72.1 kg)   SpO2 99%   BMI 28.17 kg/m   BP/Weight 11/10/2016 05/30/2016 9/62/9528  Systolic BP 413 244 010  Diastolic BP 272 85 82  Wt. (Lbs) 159 165  159  BMI 28.17 29.23 27.08      Physical Exam  Constitutional: He is oriented to person, place, and time. He appears well-developed and well-nourished.  Neck: Normal range of motion. No JVD present.  Cardiovascular: Normal rate, normal heart sounds and intact distal pulses.   No murmur heard. Pulmonary/Chest: Effort normal and breath sounds normal. He has no wheezes. He has no rales. He exhibits no tenderness.  Abdominal: Soft. Bowel sounds are normal. He exhibits no distension and no mass. There is no tenderness.  Musculoskeletal: Normal range of  motion.  Neurological: He is alert and oriented to person, place, and time.  Skin: Skin is warm and dry.  Psychiatric: He has a normal mood and affect.     CMP Latest Ref Rng & Units 05/30/2016 09/27/2015 06/24/2015  Glucose 65 - 99 mg/dL 121(H) 112(H) 126(H)  BUN 6 - 24 mg/dL '15 20 18  ' Creatinine 0.76 - 1.27 mg/dL 0.93 0.91 1.04  Sodium 134 - 144 mmol/L 138 139 135  Potassium 3.5 - 5.2 mmol/L 4.3 4.3 3.0(L)  Chloride 96 - 106 mmol/L 101 105 93(L)  CO2 18 - 29 mmol/L '21 22 30  ' Calcium 8.7 - 10.2 mg/dL 9.1 9.3 9.5  Total Protein 6.0 - 8.5 g/dL 7.7 7.7 8.3(H)  Total Bilirubin 0.0 - 1.2 mg/dL 0.8 0.7 1.0  Alkaline Phos 39 - 117 IU/L 80 72 84  AST 0 - 40 IU/L '22 14 17  ' ALT 0 - 44 IU/L '28 19 16    ' Lipid Panel     Component Value Date/Time   CHOL 139 05/30/2016 1116   TRIG 94 05/30/2016 1116   HDL 39 (L) 05/30/2016 1116   CHOLHDL 3.6 05/30/2016 1116   CHOLHDL 3.3 06/24/2015 0850   VLDL 19 06/24/2015 0850   LDLCALC 81 05/30/2016 1116    Lab Results  Component Value Date   HGBA1C 6.1 11/10/2016      Assessment & Plan:   1. Type 2 diabetes mellitus with hyperosmolarity without coma, without long-term current use of insulin (HCC) Diet controlled with A1c of 6.1 Diabetic diet, lifestyle modifications - POCT glucose (manual entry) - POCT glycosylated hemoglobin (Hb A1C) - Ambulatory referral to Ophthalmology  2. Gastroesophageal reflux disease with esophagitis Controlled Avoid late meals - omeprazole (PRILOSEC) 20 MG capsule; Take 1 capsule (20 mg total) by mouth daily.  Dispense: 30 capsule; Refill: 5  3. Pure hypercholesterolemia Controlled Low-cholesterol diet - atorvastatin (LIPITOR) 20 MG tablet; Take 1 tablet (20 mg total) by mouth daily at 6 PM.  Dispense: 30 tablet; Refill: 5  4. Essential hypertension Uncontrolled-patient just took medications No regimen changed today Low-sodium,DASH diet - losartan (COZAAR) 50 MG tablet; Take 1 tablet (50 mg total) by mouth  daily.  Dispense: 30 tablet; Refill: 5 - carvedilol (COREG) 6.25 MG tablet; Take 1 tablet (6.25 mg total) by mouth 2 (two) times daily with a meal.  Dispense: 60 tablet; Refill: 5  5. Neck pain Likely muscle spasm Advised to apply heat - methocarbamol (ROBAXIN) 500 MG tablet; Take 2 tablets (1,000 mg total) by mouth at bedtime as needed for muscle spasms.  Dispense: 60 tablet; Refill: 2  6. Tearing eyes We'll treat for allergic conjunctivitis - olopatadine (PATANOL) 0.1 % ophthalmic solution; Place 1 drop into both eyes 2 (two) times daily.  Dispense: 5 mL; Refill: 1  He will need a follow up imaging of his thoracic aortic aneurysm - will place order.  Meds ordered this encounter  Medications  .  omeprazole (PRILOSEC) 20 MG capsule    Sig: Take 1 capsule (20 mg total) by mouth daily.    Dispense:  30 capsule    Refill:  5  . atorvastatin (LIPITOR) 20 MG tablet    Sig: Take 1 tablet (20 mg total) by mouth daily at 6 PM.    Dispense:  30 tablet    Refill:  5  . losartan (COZAAR) 50 MG tablet    Sig: Take 1 tablet (50 mg total) by mouth daily.    Dispense:  30 tablet    Refill:  5  . carvedilol (COREG) 6.25 MG tablet    Sig: Take 1 tablet (6.25 mg total) by mouth 2 (two) times daily with a meal.    Dispense:  60 tablet    Refill:  5  . olopatadine (PATANOL) 0.1 % ophthalmic solution    Sig: Place 1 drop into both eyes 2 (two) times daily.    Dispense:  5 mL    Refill:  1  . methocarbamol (ROBAXIN) 500 MG tablet    Sig: Take 2 tablets (1,000 mg total) by mouth at bedtime as needed for muscle spasms.    Dispense:  60 tablet    Refill:  2    Follow-up: Return in about 3 months (around 02/10/2017) for Follow-up on diabetes mellitus.   Arnoldo Morale MD

## 2016-11-10 NOTE — Patient Instructions (Signed)

## 2016-11-13 ENCOUNTER — Ambulatory Visit: Payer: BLUE CROSS/BLUE SHIELD | Attending: Family Medicine

## 2016-11-13 ENCOUNTER — Other Ambulatory Visit: Payer: Self-pay

## 2016-11-13 ENCOUNTER — Other Ambulatory Visit: Payer: Self-pay | Admitting: Family Medicine

## 2016-11-13 DIAGNOSIS — I10 Essential (primary) hypertension: Secondary | ICD-10-CM | POA: Insufficient documentation

## 2016-11-13 MED ORDER — OLOPATADINE HCL 0.2 % OP SOLN: OPHTHALMIC | 1 refills | Status: AC

## 2016-11-13 NOTE — Progress Notes (Signed)
Patient here for lab visit only 

## 2016-11-14 LAB — BASIC METABOLIC PANEL
BUN/Creatinine Ratio: 16 (ref 9–20)
BUN: 14 mg/dL (ref 6–24)
CO2: 24 mmol/L (ref 20–29)
Calcium: 9 mg/dL (ref 8.7–10.2)
Chloride: 101 mmol/L (ref 96–106)
Creatinine, Ser: 0.89 mg/dL (ref 0.76–1.27)
GFR calc Af Amer: 116 mL/min/{1.73_m2} (ref >59)
GFR calc non Af Amer: 100 mL/min/{1.73_m2} (ref >59)
Glucose: 110 mg/dL — ABNORMAL HIGH (ref 65–99)
Potassium: 3.8 mmol/L (ref 3.5–5.2)
Sodium: 141 mmol/L (ref 134–144)

## 2016-11-16 ENCOUNTER — Ambulatory Visit (HOSPITAL_COMMUNITY): Payer: BLUE CROSS/BLUE SHIELD

## 2016-11-21 ENCOUNTER — Telehealth: Payer: Self-pay | Admitting: Family Medicine

## 2016-11-21 NOTE — Telephone Encounter (Signed)
The radiology dept. Called to request a change on the order for this Pt, the order has to read the fallowing   CT ANGIO CHEST A ORDER W//OR WO CONTRAST  PLEASE FOLLOW UP

## 2016-11-21 NOTE — Telephone Encounter (Signed)
Looks like the change has been made.

## 2016-11-22 ENCOUNTER — Ambulatory Visit (HOSPITAL_COMMUNITY): Admission: RE | Admit: 2016-11-22 | Payer: BLUE CROSS/BLUE SHIELD | Source: Ambulatory Visit

## 2016-11-22 MED FILL — OLOPATADINE HCL 0.2 % SOLN: 0.2 | 10 days supply | Qty: 3 | Fill #0

## 2016-11-30 ENCOUNTER — Encounter (HOSPITAL_COMMUNITY): Payer: Self-pay

## 2016-11-30 ENCOUNTER — Ambulatory Visit (HOSPITAL_COMMUNITY)
Admission: RE | Admit: 2016-11-30 | Discharge: 2016-11-30 | Disposition: A | Discharge status: Home / Self Care | Payer: BLUE CROSS/BLUE SHIELD | Source: Ambulatory Visit | Attending: Family Medicine | Admitting: Family Medicine

## 2016-11-30 ENCOUNTER — Telehealth: Payer: Self-pay | Admitting: Internal Medicine

## 2016-11-30 DIAGNOSIS — I716 Thoracoabdominal aortic aneurysm, without rupture, unspecified: Secondary | ICD-10-CM

## 2016-11-30 DIAGNOSIS — I712 Thoracic aortic aneurysm, without rupture, unspecified: Secondary | ICD-10-CM

## 2016-11-30 DIAGNOSIS — I77810 Thoracic aortic ectasia: Secondary | ICD-10-CM

## 2016-11-30 DIAGNOSIS — I713 Abdominal aortic aneurysm, ruptured, unspecified: Secondary | ICD-10-CM

## 2016-11-30 NOTE — Telephone Encounter (Signed)
Need a new  Order for this pt since the order is old need a update one for CT ANGIO CHEST A ORDER W//OR WO CONTRAST also need a pre-auth  Please pt has an appt at 10:30 am today

## 2016-11-30 NOTE — Telephone Encounter (Signed)
The order needs to be [id number] also the CT needs to have a pre-authorization  Alycia when Dr. Laural BenesJohnson puts the order in can you do the pre-authorization

## 2016-11-30 NOTE — Telephone Encounter (Signed)
Also they want a pre-auth for abdominal tube , please follow up

## 2016-12-05 NOTE — Telephone Encounter (Signed)
[facility] called again since the order is incorrect still in the system, they need the order to be said CT Angio Abdominal and Pelvis with/with out also need a pre-auth

## 2016-12-08 NOTE — Telephone Encounter (Signed)
[facility] needs the order to be changed in the system. Pt will need a CT Angio Abdominal and pelvis with/without. Can you please change the order.

## 2016-12-08 NOTE — Telephone Encounter (Signed)
Order has been placed.

## 2016-12-18 MED FILL — OLOPATADINE HCL 0.2 % SOLN: 0.2 | 10 days supply | Qty: 3 | Fill #1

## 2016-12-18 MED FILL — LOSARTAN POTASSIUM 50 MG TA: 50 | 30 days supply | Qty: 30 | Fill #1

## 2016-12-18 MED FILL — ?OMEPRAZOLE DR 20 MG CAPSUL: 20 | 30 days supply | Qty: 30 | Fill #1

## 2016-12-18 MED FILL — ?CARVEDILOL 6.25 MG TABLET: 6.25 | 30 days supply | Qty: 60 | Fill #1

## 2016-12-18 MED FILL — ?ATORVASTATIN 20 MG TABLET: 20 | 30 days supply | Qty: 30 | Fill #1

## 2017-01-26 MED FILL — ?OMEPRAZOLE DR 20MG CAPSULE: 20 | 30 days supply | Qty: 30 | Fill #2

## 2017-01-26 MED FILL — LOSARTAN POTASSIUM 50 MG TA: 50 | 30 days supply | Qty: 30 | Fill #2

## 2017-02-09 ENCOUNTER — Ambulatory Visit: Payer: BLUE CROSS/BLUE SHIELD | Attending: Family Medicine | Admitting: Family Medicine

## 2017-02-09 ENCOUNTER — Encounter: Payer: Self-pay | Admitting: Family Medicine

## 2017-02-09 VITALS — BP 148/95 | HR 68 | Temp 97.4°F | Ht 63.0 in | Wt 161.6 lb

## 2017-02-09 DIAGNOSIS — R252 Cramp and spasm: Secondary | ICD-10-CM

## 2017-02-09 DIAGNOSIS — I712 Thoracic aortic aneurysm, without rupture: Secondary | ICD-10-CM | POA: Diagnosis not present

## 2017-02-09 DIAGNOSIS — E7800 Pure hypercholesterolemia, unspecified: Secondary | ICD-10-CM | POA: Diagnosis not present

## 2017-02-09 DIAGNOSIS — Z79899 Other long term (current) drug therapy: Secondary | ICD-10-CM | POA: Insufficient documentation

## 2017-02-09 DIAGNOSIS — K2100 Gastro-esophageal reflux disease with esophagitis, without bleeding: Secondary | ICD-10-CM

## 2017-02-09 DIAGNOSIS — I10 Essential (primary) hypertension: Secondary | ICD-10-CM | POA: Diagnosis not present

## 2017-02-09 DIAGNOSIS — Z8673 Personal history of transient ischemic attack (TIA), and cerebral infarction without residual deficits: Secondary | ICD-10-CM | POA: Insufficient documentation

## 2017-02-09 DIAGNOSIS — Z7982 Long term (current) use of aspirin: Secondary | ICD-10-CM | POA: Insufficient documentation

## 2017-02-09 DIAGNOSIS — E119 Type 2 diabetes mellitus without complications: Secondary | ICD-10-CM | POA: Diagnosis present

## 2017-02-09 DIAGNOSIS — E1100 Type 2 diabetes mellitus with hyperosmolarity without nonketotic hyperglycemic-hyperosmolar coma (NKHHC): Secondary | ICD-10-CM

## 2017-02-09 LAB — POCT GLYCOSYLATED HEMOGLOBIN (HGB A1C): Hemoglobin A1C: 6.4

## 2017-02-09 LAB — GLUCOSE, POCT (MANUAL RESULT ENTRY): POC Glucose: 120 mg/dl — AB (ref 70–99)

## 2017-02-09 MED ORDER — ATORVASTATIN CALCIUM 20 MG PO TABS: 20.0000 mg | ORAL_TABLET | Freq: Every day | ORAL | 5 refills | Status: DC

## 2017-02-09 MED ORDER — METFORMIN HCL 500 MG PO TABS: 500.0000 mg | ORAL_TABLET | Freq: Every day | ORAL | 5 refills | Status: DC

## 2017-02-09 MED ORDER — METHOCARBAMOL 500 MG PO TABS: 1000.0000 mg | ORAL_TABLET | Freq: Every evening | ORAL | 2 refills | Status: DC | PRN

## 2017-02-09 MED ORDER — CARVEDILOL 6.25 MG PO TABS: 6.2500 mg | ORAL_TABLET | Freq: Two times a day (BID) | ORAL | 5 refills | Status: DC

## 2017-02-09 MED ORDER — LOSARTAN POTASSIUM 100 MG PO TABS: 100.0000 mg | ORAL_TABLET | Freq: Every day | ORAL | 5 refills | Status: DC

## 2017-02-09 MED ORDER — OMEPRAZOLE 20 MG PO CPDR: 20.0000 mg | DELAYED_RELEASE_CAPSULE | Freq: Every day | ORAL | 5 refills | Status: DC

## 2017-02-09 MED FILL — ?CARVEDILOL 6.25 MG TABLET: 6.25 | 30 days supply | Qty: 60 | Fill #0

## 2017-02-09 MED FILL — ?METFORMIN HCL 500MG TABLET: 500 | 30 days supply | Qty: 30 | Fill #0

## 2017-02-09 MED FILL — ?ATORVASTATIN 20MG TABLET: 20 | 30 days supply | Qty: 30 | Fill #0

## 2017-02-09 MED FILL — METHOCARBAMOL 500 MG TABS: 500 | 30 days supply | Qty: 60 | Fill #0

## 2017-02-09 NOTE — Patient Instructions (Signed)
Diabetes Mellitus and Nutrition When you have diabetes (diabetes mellitus), it is very important to have healthy eating habits because your blood sugar (glucose) levels are greatly affected by what you eat and drink. Eating healthy foods in the appropriate amounts, at about the same times every day, can help you:  Control your blood glucose.  Lower your risk of heart disease.  Improve your blood pressure.  Reach or maintain a healthy weight.  Every person with diabetes is different, and each person has different needs for a meal plan. Your health care provider may recommend that you work with a diet and nutrition specialist (dietitian) to make a meal plan that is best for you. Your meal plan may vary depending on factors such as:  The calories you need.  The medicines you take.  Your weight.  Your blood glucose, blood pressure, and cholesterol levels.  Your activity level.  Other health conditions you have, such as heart or kidney disease.  How do carbohydrates affect me? Carbohydrates affect your blood glucose level more than any other type of food. Eating carbohydrates naturally increases the amount of glucose in your blood. Carbohydrate counting is a method for keeping track of how many carbohydrates you eat. Counting carbohydrates is important to keep your blood glucose at a healthy level, especially if you use insulin or take certain oral diabetes medicines. It is important to know how many carbohydrates you can safely have in each meal. This is different for every person. Your dietitian can help you calculate how many carbohydrates you should have at each meal and for snack. Foods that contain carbohydrates include:  Bread, cereal, rice, pasta, and crackers.  Potatoes and corn.  Peas, beans, and lentils.  Milk and yogurt.  Fruit and juice.  Desserts, such as cakes, cookies, ice cream, and candy.  How does alcohol affect me? Alcohol can cause a sudden decrease in blood  glucose (hypoglycemia), especially if you use insulin or take certain oral diabetes medicines. Hypoglycemia can be a life-threatening condition. Symptoms of hypoglycemia (sleepiness, dizziness, and confusion) are similar to symptoms of having too much alcohol. If your health care provider says that alcohol is safe for you, follow these guidelines:  Limit alcohol intake to no more than 1 drink per day for nonpregnant women and 2 drinks per day for men. One drink equals 12 oz of beer, 5 oz of wine, or 1 oz of hard liquor.  Do not drink on an empty stomach.  Keep yourself hydrated with water, diet soda, or unsweetened iced tea.  Keep in mind that regular soda, juice, and other mixers may contain a lot of sugar and must be counted as carbohydrates.  What are tips for following this plan? Reading food labels  Start by checking the serving size on the label. The amount of calories, carbohydrates, fats, and other nutrients listed on the label are based on one serving of the food. Many foods contain more than one serving per package.  Check the total grams (g) of carbohydrates in one serving. You can calculate the number of servings of carbohydrates in one serving by dividing the total carbohydrates by 15. For example, if a food has 30 g of total carbohydrates, it would be equal to 2 servings of carbohydrates.  Check the number of grams (g) of saturated and trans fats in one serving. Choose foods that have low or no amount of these fats.  Check the number of milligrams (mg) of sodium in one serving. Most people   should limit total sodium intake to less than 2,300 mg per day.  Always check the nutrition information of foods labeled as "low-fat" or "nonfat". These foods may be higher in added sugar or refined carbohydrates and should be avoided.  Talk to your dietitian to identify your daily goals for nutrients listed on the label. Shopping  Avoid buying canned, premade, or processed foods. These  foods tend to be high in fat, sodium, and added sugar.  Shop around the outside edge of the grocery store. This includes fresh fruits and vegetables, bulk grains, fresh meats, and fresh dairy. Cooking  Use low-heat cooking methods, such as baking, instead of high-heat cooking methods like deep frying.  Cook using healthy oils, such as olive, canola, or sunflower oil.  Avoid cooking with butter, cream, or high-fat meats. Meal planning  Eat meals and snacks regularly, preferably at the same times every day. Avoid going long periods of time without eating.  Eat foods high in fiber, such as fresh fruits, vegetables, beans, and whole grains. Talk to your dietitian about how many servings of carbohydrates you can eat at each meal.  Eat 4-6 ounces of lean protein each day, such as lean meat, chicken, fish, eggs, or tofu. 1 ounce is equal to 1 ounce of meat, chicken, or fish, 1 egg, or 1/4 cup of tofu.  Eat some foods each day that contain healthy fats, such as avocado, nuts, seeds, and fish. Lifestyle   Check your blood glucose regularly.  Exercise at least 30 minutes 5 or more days each week, or as told by your health care provider.  Take medicines as told by your health care provider.  Do not use any products that contain nicotine or tobacco, such as cigarettes and e-cigarettes. If you need help quitting, ask your health care provider.  Work with a counselor or diabetes educator to identify strategies to manage stress and any emotional and social challenges. What are some questions to ask my health care provider?  Do I need to meet with a diabetes educator?  Do I need to meet with a dietitian?  What number can I call if I have questions?  When are the best times to check my blood glucose? Where to find more information:  American Diabetes Association: diabetes.org/food-and-fitness/food  Academy of Nutrition and Dietetics:  www.eatright.org/resources/health/diseases-and-conditions/diabetes  National Institute of Diabetes and Digestive and Kidney Diseases (NIH): www.niddk.nih.gov/health-information/diabetes/overview/diet-eating-physical-activity Summary  A healthy meal plan will help you control your blood glucose and maintain a healthy lifestyle.  Working with a diet and nutrition specialist (dietitian) can help you make a meal plan that is best for you.  Keep in mind that carbohydrates and alcohol have immediate effects on your blood glucose levels. It is important to count carbohydrates and to use alcohol carefully. This information is not intended to replace advice given to you by your health care provider. Make sure you discuss any questions you have with your health care provider. Document Released: 10/13/2004 Document Revised: 02/21/2016 Document Reviewed: 02/21/2016 Elsevier Interactive Patient Education  2018 Elsevier Inc.  

## 2017-02-09 NOTE — Progress Notes (Signed)
Subjective:  Patient ID: Fred Brown, male    DOB: 1965/06/25  Age: 52 y.o. MRN: [id number]  CC: Diabetes and Medication Refill   HPI Fred Brown is a 52 year old man with a history of HTN, hyperlipidemia, Type 2 DM (A1c 6.4), TIA, ascending aortic aneurysm who comes into the clinic for follow-up visit.  CT injury of the chest and aorta follow-up of 4.2 cm ascending aortic dilatation has been scheduled on several occassions for this patient he missed the appointment.  He was last seen by vascular surgery-Dr. Roxan Hockey in 03/2015.  His A1c is 6.4 which has trended up from 6.1 previously and he has been on diet control however he states he has to ingest lots of carbohydrates especially rice as he works long hours.  The only foods that are high in starch keep him full. He does not exercise regularly. He tolerates the statin which he takes for hypercholesterolemia and tolerates his antihypertensive with no adverse effects.  He does not have intermittent leg pains with prolonged standing which occurs at his job and he uses methocarbamol nighttime with improvement in symptoms.  Past Medical History:  Diagnosis Date  . Ascending aorta dilatation (HCC) 02/25/2015  . Essential hypertension 02/25/2015  . History of TIA (transient ischemic attack) 03/05/2015  . Type 2 diabetes mellitus ([place]) 03/06/2015    No past surgical history on file.  No Known Allergies   Outpatient Medications Prior to Visit  Medication Sig Dispense Refill  . aspirin 325 MG tablet Take 1 tablet (325 mg total) by mouth daily. 30 tablet 3  . Blood Glucose Monitoring Suppl (ONE TOUCH ULTRA 2) w/Device KIT Use as directed 1 each 0  . glucose blood (ONE TOUCH ULTRA TEST) test strip Use as instructed 30 each 5  . Lancets (ONETOUCH ULTRASOFT) lancets Use as instructed 100 each 12  . Olopatadine HCl (PATADAY) 0.2 % SOLN PLACE 1 DROP INTO BOTH EYES 2(TWO) TIMES DAILY. 2.5 mL 1  . atorvastatin (LIPITOR) 20 MG tablet Take 1 tablet (20  mg total) by mouth daily at 6 PM. 30 tablet 5  . carvedilol (COREG) 6.25 MG tablet Take 1 tablet (6.25 mg total) by mouth 2 (two) times daily with a meal. 60 tablet 5  . losartan (COZAAR) 50 MG tablet Take 1 tablet (50 mg total) by mouth daily. 30 tablet 5  . methocarbamol (ROBAXIN) 500 MG tablet Take 2 tablets (1,000 mg total) by mouth at bedtime as needed for muscle spasms. 60 tablet 2  . omeprazole (PRILOSEC) 20 MG capsule Take 1 capsule (20 mg total) by mouth daily. 30 capsule 5   No facility-administered medications prior to visit.     ROS Review of Systems  Constitutional: Negative for activity change and appetite change.  HENT: Negative for sinus pressure and sore throat.   Eyes: Negative for visual disturbance.  Respiratory: Negative for cough, chest tightness and shortness of breath.   Cardiovascular: Negative for chest pain and leg swelling.  Gastrointestinal: Negative for abdominal distention, abdominal pain, constipation and diarrhea.  Endocrine: Negative.   Genitourinary: Negative for dysuria.  Musculoskeletal: Negative for myalgias.       See hpi  Skin: Negative for rash.  Allergic/Immunologic: Negative.   Neurological: Negative for weakness, light-headedness and numbness.  Psychiatric/Behavioral: Negative for dysphoric mood and suicidal ideas.    Objective:  BP (!) 148/95   Pulse 68   Temp (!) 97.4 F (36.3 C) (Oral)   Ht '5\' 3"'  (1.6 m)  Wt 161 lb 9.6 oz (73.3 kg)   SpO2 98%   BMI 28.63 kg/m   BP/Weight 02/09/2017 60/10/9321 06/03/7320  Systolic BP 025 427 062  Diastolic BP 95 376 85  Wt. (Lbs) 161.6 159 165  BMI 28.63 28.17 29.23      Physical Exam  Constitutional: He is oriented to person, place, and time. He appears well-developed and well-nourished.  Cardiovascular: Normal rate, normal heart sounds and intact distal pulses.  No murmur heard. Pulmonary/Chest: Effort normal and breath sounds normal. He has no wheezes. He has no rales. He exhibits no  tenderness.  Abdominal: Soft. Bowel sounds are normal. He exhibits no distension and no mass. There is no tenderness.  Musculoskeletal: Normal range of motion.  Neurological: He is alert and oriented to person, place, and time.  Skin: Skin is warm and dry.  Psychiatric: He has a normal mood and affect.     CMP Latest Ref Rng & Units 11/13/2016 05/30/2016 09/27/2015  Glucose 65 - 99 mg/dL 110(H) 121(H) 112(H)  BUN 6 - 24 mg/dL '14 15 20  ' Creatinine 0.76 - 1.27 mg/dL 0.89 0.93 0.91  Sodium 134 - 144 mmol/L 141 138 139  Potassium 3.5 - 5.2 mmol/L 3.8 4.3 4.3  Chloride 96 - 106 mmol/L 101 101 105  CO2 20 - 29 mmol/L '24 21 22  ' Calcium 8.7 - 10.2 mg/dL 9.0 9.1 9.3  Total Protein 6.0 - 8.5 g/dL - 7.7 7.7  Total Bilirubin 0.0 - 1.2 mg/dL - 0.8 0.7  Alkaline Phos 39 - 117 IU/L - 80 72  AST 0 - 40 IU/L - 22 14  ALT 0 - 44 IU/L - 28 19    Lipid Panel     Component Value Date/Time   CHOL 139 05/30/2016 1116   TRIG 94 05/30/2016 1116   HDL 39 (L) 05/30/2016 1116   CHOLHDL 3.6 05/30/2016 1116   CHOLHDL 3.3 06/24/2015 0850   VLDL 19 06/24/2015 0850   LDLCALC 81 05/30/2016 1116    Lab Results  Component Value Date   HGBA1C 6.4 02/09/2017    Assessment & Plan:   1. Type 2 diabetes mellitus with hyperosmolarity without coma, without long-term current use of insulin (HCC) A1c of 6.4 which has trended up from 6.1 previously Commence metformin Counseled on compliance with diabetic diet - POCT glucose (manual entry) - POCT glycosylated hemoglobin (Hb A1C) - metFORMIN (GLUCOPHAGE) 500 MG tablet; Take 1 tablet (500 mg total) by mouth daily with breakfast.  Dispense: 30 tablet; Refill: 5  2. Pure hypercholesterolemia Controlled Low-cholesterol diet - atorvastatin (LIPITOR) 20 MG tablet; Take 1 tablet (20 mg total) by mouth daily at 6 PM.  Dispense: 30 tablet; Refill: 5  3. Essential hypertension Uncontrolled Increase dose of losartan Low-sodium diet - carvedilol (COREG) 6.25 MG  tablet; Take 1 tablet (6.25 mg total) by mouth 2 (two) times daily with a meal.  Dispense: 60 tablet; Refill: 5 - losartan (COZAAR) 100 MG tablet; Take 1 tablet (100 mg total) by mouth daily.  Dispense: 30 tablet; Refill: 5  4. Leg cramps Intermittent leg cramps which occur after prolonged standing at work, relieved by use of Robaxin - methocarbamol (ROBAXIN) 500 MG tablet; Take 2 tablets (1,000 mg total) by mouth at bedtime as needed for muscle spasms.  Dispense: 60 tablet; Refill: 2  5. Gastroesophageal reflux disease with esophagitis Controlled - omeprazole (PRILOSEC) 20 MG capsule; Take 1 capsule (20 mg total) by mouth daily.  Dispense: 30 capsule; Refill: 5  Meds ordered this encounter  Medications  . atorvastatin (LIPITOR) 20 MG tablet    Sig: Take 1 tablet (20 mg total) by mouth daily at 6 PM.    Dispense:  30 tablet    Refill:  5  . carvedilol (COREG) 6.25 MG tablet    Sig: Take 1 tablet (6.25 mg total) by mouth 2 (two) times daily with a meal.    Dispense:  60 tablet    Refill:  5  . losartan (COZAAR) 100 MG tablet    Sig: Take 1 tablet (100 mg total) by mouth daily.    Dispense:  30 tablet    Refill:  5    Discontinue previous dose  . methocarbamol (ROBAXIN) 500 MG tablet    Sig: Take 2 tablets (1,000 mg total) by mouth at bedtime as needed for muscle spasms.    Dispense:  60 tablet    Refill:  2  . omeprazole (PRILOSEC) 20 MG capsule    Sig: Take 1 capsule (20 mg total) by mouth daily.    Dispense:  30 capsule    Refill:  5  . metFORMIN (GLUCOPHAGE) 500 MG tablet    Sig: Take 1 tablet (500 mg total) by mouth daily with breakfast.    Dispense:  30 tablet    Refill:  5    Follow-up: Return in about 3 months (around 05/10/2017) for Follow up of chronic medical conditions.   Arnoldo Morale MD

## 2017-02-16 ENCOUNTER — Telehealth: Payer: Self-pay | Admitting: Family Medicine

## 2017-02-16 DIAGNOSIS — I10 Essential (primary) hypertension: Secondary | ICD-10-CM

## 2017-02-16 NOTE — Telephone Encounter (Signed)
Done

## 2017-02-16 NOTE — Telephone Encounter (Signed)
Radiology department called requesting for pt. PCP to put in a order for lab Creatnine and in the comment to put ISTAT. The pt. Has an appt. To have a CT done on 02/19/17. Please f/u

## 2017-02-19 ENCOUNTER — Ambulatory Visit (HOSPITAL_COMMUNITY)
Admission: RE | Admit: 2017-02-19 | Discharge: 2017-02-19 | Disposition: A | Discharge status: Home / Self Care | Payer: BLUE CROSS/BLUE SHIELD | Source: Ambulatory Visit | Attending: Internal Medicine | Admitting: Internal Medicine

## 2017-02-19 ENCOUNTER — Ambulatory Visit (HOSPITAL_COMMUNITY): Payer: BLUE CROSS/BLUE SHIELD

## 2017-02-19 ENCOUNTER — Encounter (HOSPITAL_COMMUNITY): Payer: Self-pay

## 2017-02-19 DIAGNOSIS — I700 Atherosclerosis of aorta: Secondary | ICD-10-CM | POA: Insufficient documentation

## 2017-02-19 DIAGNOSIS — I712 Thoracic aortic aneurysm, without rupture, unspecified: Secondary | ICD-10-CM

## 2017-02-19 LAB — POCT I-STAT CREATININE: Creatinine, Ser: 0.8 mg/dL (ref 0.61–1.24)

## 2017-02-19 MED ORDER — IOPAMIDOL (ISOVUE-370) INJECTION 76%
100.0000 mL | Freq: Once | INTRAVENOUS | Status: AC | PRN
  Administered 2017-02-19: 100 mL via INTRAVENOUS

## 2017-02-19 MED ORDER — IOPAMIDOL (ISOVUE-300) INJECTION 61%
INTRAVENOUS | Status: AC
  Filled 2017-02-19: qty 100

## 2017-02-19 MED ORDER — IOPAMIDOL (ISOVUE-370) INJECTION 76%
INTRAVENOUS | Status: AC
  Filled 2017-02-19: qty 100

## 2017-02-26 MED FILL — LOSARTAN POTASSIUM 50 MG TA: 50 | 30 days supply | Qty: 30 | Fill #3

## 2017-02-26 MED FILL — ?OMEPRAZOLE 20 MG CPDR: 20 | 30 days supply | Qty: 30 | Fill #3

## 2017-02-27 ENCOUNTER — Telehealth: Payer: Self-pay

## 2017-02-27 NOTE — Telephone Encounter (Signed)
Patient was called and informed of CT scan results. 

## 2017-03-15 MED FILL — ?CARVEDILOL 6.25 MG TABLET: 6.25 | 30 days supply | Qty: 60 | Fill #1

## 2017-03-15 MED FILL — ?METFORMIN HCL 500MG TABLET: 500 | 30 days supply | Qty: 30 | Fill #1

## 2017-03-30 MED FILL — LOSARTAN POTASSIUM 50 MG TA: 50 | 30 days supply | Qty: 30 | Fill #4

## 2017-03-30 MED FILL — OMEPRAZOLE DR 20 MG CAPSULE: 20 | 30 days supply | Qty: 30 | Fill #4

## 2017-03-30 MED FILL — METHOCARBAMOL 500 MG TABLET: 500 | 30 days supply | Qty: 60 | Fill #1

## 2017-03-30 MED FILL — ATORVASTATIN 20 MG TABLET: 20 | 30 days supply | Qty: 30 | Fill #1

## 2017-04-16 MED FILL — metFORMIN HCL 500 MG TABS: 500 | 30 days supply | Qty: 30 | Fill #2

## 2017-04-25 ENCOUNTER — Other Ambulatory Visit: Payer: Self-pay | Admitting: Family Medicine

## 2017-04-25 DIAGNOSIS — I10 Essential (primary) hypertension: Secondary | ICD-10-CM

## 2017-04-25 MED FILL — LOSARTAN POTASSIUM 50 MG TA: 50 | 30 days supply | Qty: 30 | Fill #5

## 2017-04-25 MED FILL — CARVEDILOL 6.25 MG TABLET: 6.25 | 30 days supply | Qty: 60 | Fill #2

## 2017-04-30 MED FILL — OMEPRAZOLE DR 20 MG CAPSULE: 20 | 30 days supply | Qty: 30 | Fill #5

## 2017-05-07 ENCOUNTER — Encounter: Payer: Self-pay | Admitting: Family Medicine

## 2017-05-07 ENCOUNTER — Ambulatory Visit: Payer: BLUE CROSS/BLUE SHIELD | Attending: Family Medicine | Admitting: Family Medicine

## 2017-05-07 VITALS — BP 134/90 | HR 74 | Temp 97.6°F | Wt 158.2 lb

## 2017-05-07 DIAGNOSIS — I10 Essential (primary) hypertension: Secondary | ICD-10-CM | POA: Insufficient documentation

## 2017-05-07 DIAGNOSIS — Z79899 Other long term (current) drug therapy: Secondary | ICD-10-CM | POA: Insufficient documentation

## 2017-05-07 DIAGNOSIS — I713 Abdominal aortic aneurysm, ruptured, unspecified: Secondary | ICD-10-CM

## 2017-05-07 DIAGNOSIS — Z8673 Personal history of transient ischemic attack (TIA), and cerebral infarction without residual deficits: Secondary | ICD-10-CM | POA: Diagnosis not present

## 2017-05-07 DIAGNOSIS — E1100 Type 2 diabetes mellitus with hyperosmolarity without nonketotic hyperglycemic-hyperosmolar coma (NKHHC): Secondary | ICD-10-CM | POA: Diagnosis not present

## 2017-05-07 DIAGNOSIS — Z0000 Encounter for general adult medical examination without abnormal findings: Secondary | ICD-10-CM | POA: Diagnosis not present

## 2017-05-07 DIAGNOSIS — E119 Type 2 diabetes mellitus without complications: Secondary | ICD-10-CM | POA: Insufficient documentation

## 2017-05-07 DIAGNOSIS — Z7984 Long term (current) use of oral hypoglycemic drugs: Secondary | ICD-10-CM | POA: Insufficient documentation

## 2017-05-07 DIAGNOSIS — K2100 Gastro-esophageal reflux disease with esophagitis, without bleeding: Secondary | ICD-10-CM

## 2017-05-07 DIAGNOSIS — R252 Cramp and spasm: Secondary | ICD-10-CM | POA: Diagnosis not present

## 2017-05-07 DIAGNOSIS — I712 Thoracic aortic aneurysm, without rupture: Secondary | ICD-10-CM | POA: Diagnosis not present

## 2017-05-07 DIAGNOSIS — Z7982 Long term (current) use of aspirin: Secondary | ICD-10-CM | POA: Insufficient documentation

## 2017-05-07 DIAGNOSIS — E7800 Pure hypercholesterolemia, unspecified: Secondary | ICD-10-CM | POA: Diagnosis not present

## 2017-05-07 LAB — POCT GLYCOSYLATED HEMOGLOBIN (HGB A1C): Hemoglobin A1C: 6.2

## 2017-05-07 LAB — GLUCOSE, POCT (MANUAL RESULT ENTRY): POC Glucose: 122 mg/dl — AB (ref 70–99)

## 2017-05-07 MED ORDER — METFORMIN HCL 500 MG PO TABS: 500.0000 mg | ORAL_TABLET | Freq: Every day | ORAL | 5 refills | Status: DC

## 2017-05-07 MED ORDER — METHOCARBAMOL 500 MG PO TABS: 1000.0000 mg | ORAL_TABLET | Freq: Every evening | ORAL | 2 refills | Status: DC | PRN

## 2017-05-07 MED ORDER — OMEPRAZOLE 20 MG PO CPDR: 20.0000 mg | DELAYED_RELEASE_CAPSULE | Freq: Every day | ORAL | 5 refills | Status: DC

## 2017-05-07 MED ORDER — ATORVASTATIN CALCIUM 20 MG PO TABS: 20.0000 mg | ORAL_TABLET | Freq: Every day | ORAL | 5 refills | Status: DC

## 2017-05-07 MED ORDER — LOSARTAN POTASSIUM 100 MG PO TABS: 100.0000 mg | ORAL_TABLET | Freq: Every day | ORAL | 5 refills | Status: DC

## 2017-05-07 MED ORDER — CARVEDILOL 6.25 MG PO TABS: 6.2500 mg | ORAL_TABLET | Freq: Two times a day (BID) | ORAL | 5 refills | Status: DC

## 2017-05-07 MED FILL — ATORVASTATIN 20 MG TABLET: 20 | 30 days supply | Qty: 30 | Fill #0

## 2017-05-07 MED FILL — LOSARTAN POTASSIUM 100 MG T: 100 | 30 days supply | Qty: 30 | Fill #0

## 2017-05-07 MED FILL — METHOCARBAMOL 500 MG TABLET: 500 | 30 days supply | Qty: 60 | Fill #0

## 2017-05-07 NOTE — Progress Notes (Signed)
Subjective:  Patient ID: Fred Brown, male    DOB: 1965/03/08  Age: 52 y.o. MRN: [id number]  CC: Diabetes   HPI Fred Brown is a 52 year old man with a history of HTN, hyperlipidemia, Type 2 DM (A1c 6.2), TIA, ascending aortic aneurysm who comes into the clinic for follow-up visit.  His A1c is 6.2 which has improved from 6.4 previously after addition of metformin at his last visit.  His diet consists of starchy foods and he does not exercise regularly.  Denies numbness in extremities or visual concerns. He tolerates the statin which he takes for hypercholesterolemia and tolerates his antihypertensive with no adverse effects. 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  CT angiogram of the chest with and without contrast for follow-up of his ascending aortic aneurysm revealed stable 4.2 cm aneurysm of the ascending aorta, no evidence of dissection or intramural hematoma. He denies chest pains or shortness of breath.  Past Medical History:  Diagnosis Date  . Ascending aorta dilatation (HCC) 02/25/2015  . Essential hypertension 02/25/2015  . History of TIA (transient ischemic attack) 03/05/2015  . Type 2 diabetes mellitus ([place]) 03/06/2015    History reviewed. No pertinent surgical history.  History reviewed. No pertinent family history.  No Known Allergies  Outpatient Medications Prior to Visit  Medication Sig Dispense Refill  . aspirin 325 MG tablet Take 1 tablet (325 mg total) by mouth daily. 30 tablet 3  . Blood Glucose Monitoring Suppl (ONE TOUCH ULTRA 2) w/Device KIT Use as directed 1 each 0  . glucose blood (ONE TOUCH ULTRA TEST) test strip Use as instructed 30 each 5  . Lancets (ONETOUCH ULTRASOFT) lancets Use as instructed 100 each 12  . Olopatadine HCl (PATADAY) 0.2 % SOLN PLACE 1 DROP INTO BOTH EYES 2(TWO) TIMES DAILY. 2.5 mL 1  . atorvastatin (LIPITOR) 20 MG tablet  Take 1 tablet (20 mg total) by mouth daily at 6 PM. 30 tablet 5  . carvedilol (COREG) 6.25 MG tablet Take 1 tablet (6.25 mg total) by mouth 2 (two) times daily with a meal. 60 tablet 5  . losartan (COZAAR) 100 MG tablet Take 1 tablet (100 mg total) by mouth daily. 30 tablet 5  . metFORMIN (GLUCOPHAGE) 500 MG tablet Take 1 tablet (500 mg total) by mouth daily with breakfast. 30 tablet 5  . methocarbamol (ROBAXIN) 500 MG tablet Take 2 tablets (1,000 mg total) by mouth at bedtime as needed for muscle spasms. 60 tablet 2  . omeprazole (PRILOSEC) 20 MG capsule Take 1 capsule (20 mg total) by mouth daily. 30 capsule 5   No facility-administered medications prior to visit.     ROS Review of Systems  Constitutional: Negative for activity change and appetite change.  HENT: Negative for sinus pressure and sore throat.   Eyes: Negative for visual disturbance.  Respiratory: Negative for cough, chest tightness and shortness of breath.   Cardiovascular: Negative for chest pain and leg swelling.  Gastrointestinal: Negative for abdominal distention, abdominal pain, constipation and diarrhea.  Endocrine: Negative.   Genitourinary: Negative for dysuria.  Musculoskeletal:       See hpi  Skin: Negative for rash.  Allergic/Immunologic: Negative.   Neurological: Negative for weakness, light-headedness and numbness.  Psychiatric/Behavioral: Negative for dysphoric mood and suicidal ideas.    Objective:  BP 134/90   Pulse 74   Temp 97.6 F (36.4  C) (Oral)   Wt 158 lb 3.2 oz (71.8 kg)   SpO2 100%   BMI 28.02 kg/m   BP/Weight 05/07/2017 02/09/2017 93/26/7124  Systolic BP 580 998 338  Diastolic BP 90 95 250  Wt. (Lbs) 158.2 161.6 159  BMI 28.02 28.63 28.17      Physical Exam  Constitutional: He is oriented to person, place, and time. He appears well-developed and well-nourished.  Cardiovascular: Normal rate, normal heart sounds and intact distal pulses.  No murmur heard. Pulmonary/Chest: Effort  normal and breath sounds normal. He has no wheezes. He has no rales. He exhibits no tenderness.  Abdominal: Soft. Bowel sounds are normal. He exhibits no distension and no mass. There is no tenderness.  Musculoskeletal: Normal range of motion.  Neurological: He is alert and oriented to person, place, and time.  Skin: Skin is warm and dry.  Psychiatric: He has a normal mood and affect.    CMP Latest Ref Rng & Units 02/19/2017 11/13/2016 05/30/2016  Glucose 65 - 99 mg/dL - 110(H) 121(H)  BUN 6 - 24 mg/dL - 14 15  Creatinine 0.61 - 1.24 mg/dL 0.80 0.89 0.93  Sodium 134 - 144 mmol/L - 141 138  Potassium 3.5 - 5.2 mmol/L - 3.8 4.3  Chloride 96 - 106 mmol/L - 101 101  CO2 20 - 29 mmol/L - 24 21  Calcium 8.7 - 10.2 mg/dL - 9.0 9.1  Total Protein 6.0 - 8.5 g/dL - - 7.7  Total Bilirubin 0.0 - 1.2 mg/dL - - 0.8  Alkaline Phos 39 - 117 IU/L - - 80  AST 0 - 40 IU/L - - 22  ALT 0 - 44 IU/L - - 28    Lipid Panel     Component Value Date/Time   CHOL 139 05/30/2016 1116   TRIG 94 05/30/2016 1116   HDL 39 (L) 05/30/2016 1116   CHOLHDL 3.6 05/30/2016 1116   CHOLHDL 3.3 06/24/2015 0850   VLDL 19 06/24/2015 0850   LDLCALC 81 05/30/2016 1116    Lab Results  Component Value Date   HGBA1C 6.2 05/07/2017    Assessment & Plan:   1. Type 2 diabetes mellitus without complication, without long-term current use of insulin (HCC) Controlled with A1c of 6.2 Counseled on Diabetic diet, my plate method, 539 minutes of moderate intensity exercise/week Keep blood sugar logs with fasting goals of 80-120 mg/dl, random of less than 180 and in the event of sugars less than 60 mg/dl or greater than 400 mg/dl please notify the clinic ASAP. It is recommended that you undergo annual eye exams and annual foot exams. Pneumonia vaccine is recommended. - POCT glucose (manual entry) - POCT glycosylated hemoglobin (Hb A1C) - CMP14+EGFR - Lipid panel - Microalbumin/Creatinine Ratio, Urine - metFORMIN (GLUCOPHAGE)  500 MG tablet; Take 1 tablet (500 mg total) by mouth daily with breakfast.  Dispense: 30 tablet; Refill: 5  2. Health care maintenance Declines referral for colonoscopy  3. Pure hypercholesterolemia Controlled Low-cholesterol diet - atorvastatin (LIPITOR) 20 MG tablet; Take 1 tablet (20 mg total) by mouth daily at 6 PM.  Dispense: 30 tablet; Refill: 5  4. Essential hypertension Controlled Counseled on blood pressure goal of less than 130/80, low-sodium, DASH diet, medication compliance, 150 minutes of moderate intensity exercise per week. Discussed medication compliance, adverse effects. - carvedilol (COREG) 6.25 MG tablet; Take 1 tablet (6.25 mg total) by mouth 2 (two) times daily with a meal.  Dispense: 60 tablet; Refill: 5 - losartan (COZAAR) 100 MG tablet;  Take 1 tablet (100 mg total) by mouth daily.  Dispense: 30 tablet; Refill: 5  5. Leg cramps - methocarbamol (ROBAXIN) 500 MG tablet; Take 2 tablets (1,000 mg total) by mouth at bedtime as needed for muscle spasms.  Dispense: 60 tablet; Refill: 2  6. Gastroesophageal reflux disease with esophagitis Stable - omeprazole (PRILOSEC) 20 MG capsule; Take 1 capsule (20 mg total) by mouth daily.  Dispense: 30 capsule; Refill: 5  7. Abdominal aortic aneurysm, ruptured (HCC) Stable 4.2 cm thoracic aneurysm from CT of 01/2017   Meds ordered this encounter  Medications  . atorvastatin (LIPITOR) 20 MG tablet    Sig: Take 1 tablet (20 mg total) by mouth daily at 6 PM.    Dispense:  30 tablet    Refill:  5  . carvedilol (COREG) 6.25 MG tablet    Sig: Take 1 tablet (6.25 mg total) by mouth 2 (two) times daily with a meal.    Dispense:  60 tablet    Refill:  5  . losartan (COZAAR) 100 MG tablet    Sig: Take 1 tablet (100 mg total) by mouth daily.    Dispense:  30 tablet    Refill:  5    Discontinue previous dose  . metFORMIN (GLUCOPHAGE) 500 MG tablet    Sig: Take 1 tablet (500 mg total) by mouth daily with breakfast.    Dispense:  30  tablet    Refill:  5  . methocarbamol (ROBAXIN) 500 MG tablet    Sig: Take 2 tablets (1,000 mg total) by mouth at bedtime as needed for muscle spasms.    Dispense:  60 tablet    Refill:  2  . omeprazole (PRILOSEC) 20 MG capsule    Sig: Take 1 capsule (20 mg total) by mouth daily.    Dispense:  30 capsule    Refill:  5    Follow-up: Return in about 3 months (around 08/06/2017) for follow up of chronic medical conditions.   Charlott Rakes MD

## 2017-05-07 NOTE — Patient Instructions (Signed)

## 2017-05-08 LAB — CMP14+EGFR
ALT: 24 IU/L (ref 0–44)
AST: 17 IU/L (ref 0–40)
Albumin/Globulin Ratio: 1.4 (ref 1.2–2.2)
Albumin: 4.4 g/dL (ref 3.5–5.5)
Alkaline Phosphatase: 94 IU/L (ref 39–117)
BUN/Creatinine Ratio: 18 (ref 9–20)
BUN: 16 mg/dL (ref 6–24)
Bilirubin Total: 0.5 mg/dL (ref 0.0–1.2)
CO2: 21 mmol/L (ref 20–29)
Calcium: 9.2 mg/dL (ref 8.7–10.2)
Chloride: 102 mmol/L (ref 96–106)
Creatinine, Ser: 0.89 mg/dL (ref 0.76–1.27)
GFR calc Af Amer: 115 mL/min/{1.73_m2} (ref >59)
GFR calc non Af Amer: 100 mL/min/{1.73_m2} (ref >59)
Globulin, Total: 3.2 g/dL (ref 1.5–4.5)
Glucose: 109 mg/dL — ABNORMAL HIGH (ref 65–99)
Potassium: 4.1 mmol/L (ref 3.5–5.2)
Sodium: 139 mmol/L (ref 134–144)
Total Protein: 7.6 g/dL (ref 6.0–8.5)

## 2017-05-08 LAB — LIPID PANEL
Chol/HDL Ratio: 3.2 ratio (ref 0.0–5.0)
Cholesterol, Total: 136 mg/dL (ref 100–199)
HDL: 42 mg/dL (ref >39)
LDL Calculated: 78 mg/dL (ref 0–99)
Triglycerides: 81 mg/dL (ref 0–149)
VLDL Cholesterol Cal: 16 mg/dL (ref 5–40)

## 2017-05-08 LAB — MICROALBUMIN / CREATININE URINE RATIO
Creatinine, Urine: 333.2 mg/dL
Microalb/Creat Ratio: 3.4 mg/g creat (ref 0.0–30.0)
Microalbumin, Urine: 11.4 ug/mL

## 2017-05-10 ENCOUNTER — Ambulatory Visit: Payer: BLUE CROSS/BLUE SHIELD | Admitting: Family Medicine

## 2017-05-21 MED FILL — metFORMIN HCL 500 MG TABS: 500 | 30 days supply | Qty: 30 | Fill #0

## 2017-05-29 MED FILL — CARVEDILOL 6.25 MG TABLET: 6.25 | 30 days supply | Qty: 60 | Fill #3

## 2017-06-19 MED FILL — metFORMIN HCL 500 MG TABS: 500 | 30 days supply | Qty: 30 | Fill #1

## 2017-06-19 MED FILL — ATORVASTATIN 20 MG TABLET: 20 | 30 days supply | Qty: 30 | Fill #1

## 2017-06-28 MED FILL — LOSARTAN POTASSIUM 100 MG T: 100 | 30 days supply | Qty: 30 | Fill #1

## 2017-06-28 MED FILL — CARVEDILOL 6.25 MG TABLET: 6.25 | 30 days supply | Qty: 60 | Fill #4

## 2017-07-27 MED FILL — metFORMIN HCL 500 MG TABS: 500 | 30 days supply | Qty: 30 | Fill #2

## 2017-07-31 MED FILL — ATORVASTATIN 20 MG TABLET: 20 | 30 days supply | Qty: 30 | Fill #2

## 2017-07-31 MED FILL — CARVEDILOL 6.25 MG TABLET: 6.25 | 30 days supply | Qty: 60 | Fill #5

## 2017-07-31 MED FILL — METHOCARBAMOL 500 MG TABS: 500 | 30 days supply | Qty: 60 | Fill #1

## 2017-07-31 MED FILL — LOSARTAN POTASSIUM 100 MG T: 100 | 30 days supply | Qty: 30 | Fill #2

## 2017-09-03 MED FILL — metFORMIN HCL 500 MG TABS: 500 | 30 days supply | Qty: 30 | Fill #3

## 2017-09-24 ENCOUNTER — Encounter

## 2017-09-24 MED FILL — ATORVASTATIN 20 MG TABLET: 20 | 30 days supply | Qty: 30 | Fill #3

## 2017-10-05 MED FILL — LOSARTAN POTASSIUM 100 MG T: 100 | 30 days supply | Qty: 30 | Fill #3

## 2017-10-15 ENCOUNTER — Ambulatory Visit: Payer: BLUE CROSS/BLUE SHIELD | Admitting: Family Medicine

## 2017-10-18 MED FILL — metFORMIN HCL 500 MG TABS: 500 | 30 days supply | Qty: 30 | Fill #4

## 2017-10-18 MED FILL — CARVEDILOL 6.25 MG TABLET: 6.25 | 30 days supply | Qty: 60 | Fill #0

## 2017-11-30 MED FILL — LOSARTAN POTASSIUM 100 MG T: 100 | 30 days supply | Qty: 30 | Fill #4

## 2017-11-30 MED FILL — metFORMIN HCL 500 MG TABS: 500 | 30 days supply | Qty: 30 | Fill #5

## 2017-11-30 MED FILL — ATORVASTATIN 20 MG TABLET: 20 | 30 days supply | Qty: 30 | Fill #4

## 2017-12-10 MED FILL — CARVEDILOL 6.25 MG TABLET: 6.25 | 30 days supply | Qty: 60 | Fill #1

## 2018-02-01 MED FILL — LOSARTAN POTASSIUM 100 MG T: 100 | 30 days supply | Qty: 30 | Fill #5

## 2018-02-01 MED FILL — metFORMIN HCL 500 MG TABS: 500 | 30 days supply | Qty: 30 | Fill #3

## 2018-03-08 MED FILL — ATORVASTATIN 20 MG TABLET: 20 | 30 days supply | Qty: 30 | Fill #5

## 2018-03-08 MED FILL — CARVEDILOL 6.25 MG TABLET: 6.25 | 30 days supply | Qty: 60 | Fill #2

## 2018-03-29 ENCOUNTER — Other Ambulatory Visit: Payer: Self-pay | Admitting: Family Medicine

## 2018-03-29 DIAGNOSIS — I10 Essential (primary) hypertension: Secondary | ICD-10-CM

## 2018-03-29 DIAGNOSIS — E119 Type 2 diabetes mellitus without complications: Secondary | ICD-10-CM

## 2018-05-22 ENCOUNTER — Encounter: Payer: Self-pay | Admitting: Family Medicine

## 2018-05-22 ENCOUNTER — Ambulatory Visit: Payer: Self-pay | Attending: Family Medicine | Admitting: Family Medicine

## 2018-05-22 ENCOUNTER — Other Ambulatory Visit: Payer: Self-pay

## 2018-05-22 DIAGNOSIS — E7800 Pure hypercholesterolemia, unspecified: Secondary | ICD-10-CM

## 2018-05-22 DIAGNOSIS — I10 Essential (primary) hypertension: Secondary | ICD-10-CM

## 2018-05-22 DIAGNOSIS — E119 Type 2 diabetes mellitus without complications: Secondary | ICD-10-CM

## 2018-05-22 MED ORDER — CARVEDILOL 6.25 MG PO TABS: 6.2500 mg | ORAL_TABLET | Freq: Two times a day (BID) | ORAL | 1 refills | Status: DC

## 2018-05-22 MED ORDER — ATORVASTATIN CALCIUM 20 MG PO TABS: 20.0000 mg | ORAL_TABLET | Freq: Every day | ORAL | 1 refills | Status: DC

## 2018-05-22 MED ORDER — LOSARTAN POTASSIUM 100 MG PO TABS: 100.0000 mg | ORAL_TABLET | Freq: Every day | ORAL | 1 refills | Status: DC

## 2018-05-22 MED ORDER — METFORMIN HCL 500 MG PO TABS: 500.0000 mg | ORAL_TABLET | Freq: Every day | ORAL | 1 refills | Status: DC

## 2018-05-22 MED FILL — CARVEDILOL 6.25 MG TABLET: 6.25 | 30 days supply | Qty: 60 | Fill #0

## 2018-05-22 MED FILL — LOSARTAN POTASSIUM 100 MG T: 100 | 30 days supply | Qty: 30 | Fill #0

## 2018-05-22 MED FILL — metFORMIN HCL 500 MG TABS: 500 | 30 days supply | Qty: 30 | Fill #0

## 2018-05-22 MED FILL — ATORVASTATIN 20 MG TABLET: 20 | 30 days supply | Qty: 30 | Fill #0

## 2018-05-22 NOTE — Progress Notes (Signed)
Patient has been called and DOB has been verified. Patient has been screened and transferred to PCP to start phone visit.  C/C: medication refill, Patient needs letter for work.  Refills: all medications

## 2018-05-22 NOTE — Progress Notes (Signed)
Virtual Visit via Telephone Note  I connected with Fred Brown, on 05/22/2018 at 10:50 am by telephone and verified that I am speaking with the correct person using two identifiers.   Consent: I discussed the limitations, risks, security and privacy concerns of performing an evaluation and management service by telephone and the availability of in person appointments. I also discussed with the patient that there may be a patient responsible charge related to this service. The patient expressed understanding and agreed to proceed.   Location of Patient: Home  Location of Provider: Clinic   Persons participating in Telemedicine visit: Cloys Vera- spouse Ozzie Hoyle Dr. Felecia Shelling     History of Present Illness: Fred Brown is a 53 year old man with a history of HTN, hyperlipidemia, Type 2 DM (A1c 6.2), TIA, ascending aortic aneurysm who comes into the clinic for follow-up visit. We were unable to connect the 3-way call between the patient and interpreter and so his wife served as interpreter and the patient does understand some English He has been out of his blood pressure medications and reports an elevated home systolic blood pressure of 145. He has not been checking his blood sugars since his A1c has been good. Denies chest pains, dyspnea and has no additional concerns today.  Last CT angiogram of the chest from 02/19/2017 revealed: IMPRESSION: Stable aneurysmal dilatation of the ascending aorta at 4.2 cm. There is no evidence of dissection or intramural hematoma. Recommend annual imaging followup by CTA or MRA. This recommendation follows 2010 ACCF/AHA/AATS/ACR/ASA/SCA/SCAI/SIR/STS/SVM Guidelines for the Diagnosis and Management of Patients with Thoracic Aortic Disease. Circulation. 2010; 121: W979-Y801  Aortic Atherosclerosis (ICD10-I70.0).  Past Medical History:  Diagnosis Date  . Ascending aorta dilatation (HCC) 02/25/2015  . Essential hypertension  02/25/2015  . History of TIA (transient ischemic attack) 03/05/2015  . Type 2 diabetes mellitus ([place]) 03/06/2015   No Known Allergies  Current Outpatient Medications on File Prior to Visit  Medication Sig Dispense Refill  . aspirin 325 MG tablet Take 1 tablet (325 mg total) by mouth daily. 30 tablet 3  . Blood Glucose Monitoring Suppl (ONE TOUCH ULTRA 2) w/Device KIT Use as directed 1 each 0  . glucose blood (ONE TOUCH ULTRA TEST) test strip Use as instructed 30 each 5  . Lancets (ONETOUCH ULTRASOFT) lancets Use as instructed 100 each 12  . methocarbamol (ROBAXIN) 500 MG tablet Take 2 tablets (1,000 mg total) by mouth at bedtime as needed for muscle spasms. 60 tablet 2  . Olopatadine HCl (PATADAY) 0.2 % SOLN PLACE 1 DROP INTO BOTH EYES 2(TWO) TIMES DAILY. 2.5 mL 1  . omeprazole (PRILOSEC) 20 MG capsule Take 1 capsule (20 mg total) by mouth daily. 30 capsule 5   No current facility-administered medications on file prior to visit.     Observations/Objective: Awake, alert, oriented x3 Not in acute distress   CMP Latest Ref Rng & Units 05/07/2017 02/19/2017 11/13/2016  Glucose 65 - 99 mg/dL 109(H) - 110(H)  BUN 6 - 24 mg/dL 16 - 14  Creatinine 0.76 - 1.27 mg/dL 0.89 0.80 0.89  Sodium 134 - 144 mmol/L 139 - 141  Potassium 3.5 - 5.2 mmol/L 4.1 - 3.8  Chloride 96 - 106 mmol/L 102 - 101  CO2 20 - 29 mmol/L 21 - 24  Calcium 8.7 - 10.2 mg/dL 9.2 - 9.0  Total Protein 6.0 - 8.5 g/dL 7.6 - -  Total Bilirubin 0.0 - 1.2 mg/dL 0.5 - -  Alkaline Phos 39 -  117 IU/L 94 - -  AST 0 - 40 IU/L 17 - -  ALT 0 - 44 IU/L 24 - -    Lipid Panel     Component Value Date/Time   CHOL 136 05/07/2017 0955   TRIG 81 05/07/2017 0955   HDL 42 05/07/2017 0955   CHOLHDL 3.2 05/07/2017 0955   CHOLHDL 3.3 06/24/2015 0850   VLDL 19 06/24/2015 0850   LDLCALC 78 05/07/2017 0955    Lab Results  Component Value Date   HGBA1C 6.2 05/07/2017      Assessment and Plan: 1. Pure  hypercholesterolemia Controlled Needs labs - ordered - atorvastatin (LIPITOR) 20 MG tablet; Take 1 tablet (20 mg total) by mouth daily at 6 PM.  Dispense: 90 tablet; Refill: 1  2. Essential hypertension Uncontrolled due to running out of antihypertensives which I have refilled - carvedilol (COREG) 6.25 MG tablet; Take 1 tablet (6.25 mg total) by mouth 2 (two) times daily with a meal.  Dispense: 180 tablet; Refill: 1 - losartan (COZAAR) 100 MG tablet; Take 1 tablet (100 mg total) by mouth daily. Make appt for more refills!  Dispense: 90 tablet; Refill: 1  3. Type 2 diabetes mellitus without complication, without long-term current use of insulin (HCC) Controlled with A1c of 6.2 Counseled on Diabetic diet, my plate method, 917 minutes of moderate intensity exercise/week Keep blood sugar logs with fasting goals of 80-120 mg/dl, random of less than 180 and in the event of sugars less than 60 mg/dl or greater than 400 mg/dl please notify the clinic ASAP. It is recommended that you undergo annual eye exams and annual foot exams. Pneumonia vaccine is recommended. - metFORMIN (GLUCOPHAGE) 500 MG tablet; Take 1 tablet (500 mg total) by mouth daily with breakfast. Make appt for more refills!  Dispense: 90 tablet; Refill: 1   Follow Up Instructions: 3 months   I discussed the assessment and treatment plan with the patient. The patient was provided an opportunity to ask questions and all were answered. The patient agreed with the plan and demonstrated an understanding of the instructions.   The patient was advised to call back or seek an in-person evaluation if the symptoms worsen or if the condition fails to improve as anticipated.     I provided 12 minutes total of non-face-to-face time during this encounter including median intraservice time, reviewing previous notes, labs, imaging, medications and explaining diagnosis and management.     Charlott Rakes, MD, FAAFP. [facility] and [facility], [place]   05/22/2018, 11:03 AM

## 2018-05-22 NOTE — Addendum Note (Signed)
Addended by: Ronette Deter on: 05/22/2018 03:19 PM   Modules accepted: Orders

## 2018-05-23 LAB — MICROALBUMIN / CREATININE URINE RATIO
Creatinine, Urine: 78 mg/dL
Microalb/Creat Ratio: <4 mg/g creat (ref 0–29)
Microalbumin, Urine: <3 ug/mL

## 2018-05-23 LAB — CMP14+EGFR
ALT: 20 IU/L (ref 0–44)
AST: 15 IU/L (ref 0–40)
Albumin/Globulin Ratio: 1.5 (ref 1.2–2.2)
Albumin: 4.6 g/dL (ref 3.8–4.9)
Alkaline Phosphatase: 96 IU/L (ref 39–117)
BUN/Creatinine Ratio: 14 (ref 9–20)
BUN: 14 mg/dL (ref 6–24)
Bilirubin Total: 0.6 mg/dL (ref 0.0–1.2)
CO2: 23 mmol/L (ref 20–29)
Calcium: 9.3 mg/dL (ref 8.7–10.2)
Chloride: 102 mmol/L (ref 96–106)
Creatinine, Ser: 0.98 mg/dL (ref 0.76–1.27)
GFR calc Af Amer: 103 mL/min/{1.73_m2} (ref >59)
GFR calc non Af Amer: 89 mL/min/{1.73_m2} (ref >59)
Globulin, Total: 3.1 g/dL (ref 1.5–4.5)
Glucose: 158 mg/dL — ABNORMAL HIGH (ref 65–99)
Potassium: 4 mmol/L (ref 3.5–5.2)
Sodium: 141 mmol/L (ref 134–144)
Total Protein: 7.7 g/dL (ref 6.0–8.5)

## 2018-06-19 MED FILL — ?METFORMIN HCL 500MG TABLET: 500 | 30 days supply | Qty: 30 | Fill #1

## 2018-06-19 MED FILL — ?CARVEDILOL 6.25 MG TABLET: 6.25 | 30 days supply | Qty: 60 | Fill #1

## 2018-06-19 MED FILL — LOSARTAN POTASSIUM 100 MG T: 100 | 30 days supply | Qty: 30 | Fill #1

## 2018-06-19 MED FILL — ?ATORVASTATIN 20 MG TABLET: 20 | 30 days supply | Qty: 30 | Fill #1

## 2018-07-22 MED FILL — LOSARTAN POTASSIUM 100 MG T: 100 | 30 days supply | Qty: 30 | Fill #2

## 2018-07-22 MED FILL — ?CARVEDILOL 6.25 MG TABLET: 6.25 | 30 days supply | Qty: 60 | Fill #2

## 2018-07-22 MED FILL — ?METFORMIN HCL 500MG TABLET: 500 | 30 days supply | Qty: 30 | Fill #2

## 2018-07-22 MED FILL — ?ATORVASTATIN 20 MG TABLET: 20 | 30 days supply | Qty: 30 | Fill #2

## 2018-08-26 ENCOUNTER — Ambulatory Visit: Payer: Self-pay | Admitting: Family Medicine

## 2018-09-06 MED FILL — ?ATORVASTATIN 20 MG TABLET: 20 | 30 days supply | Qty: 30 | Fill #3

## 2018-09-06 MED FILL — ?METFORMIN HCL 500MG TABLET: 500 | 30 days supply | Qty: 30 | Fill #3

## 2018-09-06 MED FILL — LOSARTAN POTASSIUM 100 MG T: 100 | 30 days supply | Qty: 30 | Fill #3

## 2018-09-06 MED FILL — ?CARVEDILOL 6.25 MG TABLET: 6.25 | 30 days supply | Qty: 60 | Fill #3

## 2018-10-21 MED FILL — ?CARVEDILOL 6.25 MG TABLET: 6.25 | 30 days supply | Qty: 60 | Fill #4

## 2018-10-21 MED FILL — ?METFORMIN HCL 500MG TABLET: 500 | 30 days supply | Qty: 30 | Fill #4

## 2018-10-21 MED FILL — LOSARTAN POTASSIUM 100 MG T: 100 | 30 days supply | Qty: 30 | Fill #4

## 2018-10-21 MED FILL — ?ATORVASTATIN 20 MG TABLET: 20 | 30 days supply | Qty: 30 | Fill #4

## 2018-12-03 MED FILL — ?CARVEDILOL 6.25 MG TABLET: 6.25 | 30 days supply | Qty: 60 | Fill #5

## 2018-12-03 MED FILL — ?METFORMIN HCL 500MG TABLET: 500 | 30 days supply | Qty: 30 | Fill #5

## 2018-12-03 MED FILL — ?ATORVASTATIN 20 MG TABLET: 20 | 30 days supply | Qty: 30 | Fill #5

## 2018-12-03 MED FILL — LOSARTAN POTASSIUM 100 MG T: 100 | 30 days supply | Qty: 30 | Fill #5

## 2019-03-10 ENCOUNTER — Encounter: Payer: Self-pay | Admitting: Gastroenterology

## 2019-03-10 ENCOUNTER — Other Ambulatory Visit: Payer: Self-pay

## 2019-03-10 ENCOUNTER — Ambulatory Visit: Payer: BC Managed Care – PPO | Attending: Family Medicine | Admitting: Family Medicine

## 2019-03-10 VITALS — BP 160/110 | HR 88 | Ht 63.0 in | Wt 166.2 lb

## 2019-03-10 DIAGNOSIS — E785 Hyperlipidemia, unspecified: Secondary | ICD-10-CM

## 2019-03-10 DIAGNOSIS — E1169 Type 2 diabetes mellitus with other specified complication: Secondary | ICD-10-CM

## 2019-03-10 DIAGNOSIS — M5489 Other dorsalgia: Secondary | ICD-10-CM

## 2019-03-10 DIAGNOSIS — Z1211 Encounter for screening for malignant neoplasm of colon: Secondary | ICD-10-CM

## 2019-03-10 DIAGNOSIS — I712 Thoracic aortic aneurysm, without rupture, unspecified: Secondary | ICD-10-CM

## 2019-03-10 DIAGNOSIS — E119 Type 2 diabetes mellitus without complications: Secondary | ICD-10-CM

## 2019-03-10 DIAGNOSIS — Z23 Encounter for immunization: Secondary | ICD-10-CM | POA: Diagnosis not present

## 2019-03-10 DIAGNOSIS — I10 Essential (primary) hypertension: Secondary | ICD-10-CM

## 2019-03-10 DIAGNOSIS — K2100 Gastro-esophageal reflux disease with esophagitis, without bleeding: Secondary | ICD-10-CM | POA: Diagnosis not present

## 2019-03-10 DIAGNOSIS — M549 Dorsalgia, unspecified: Secondary | ICD-10-CM

## 2019-03-10 LAB — POCT GLYCOSYLATED HEMOGLOBIN (HGB A1C): HbA1c, POC (controlled diabetic range): 6.5 % (ref 0.0–7.0)

## 2019-03-10 LAB — GLUCOSE, POCT (MANUAL RESULT ENTRY): POC Glucose: 115 mg/dl — AB (ref 70–99)

## 2019-03-10 MED ORDER — METFORMIN HCL 500 MG PO TABS
500.0000 mg | ORAL_TABLET | Freq: Every day | ORAL | 1 refills | Status: DC
Start: 2019-03-10 — End: 2019-08-11

## 2019-03-10 MED ORDER — CLONIDINE HCL 0.2 MG PO TABS
0.2000 mg | ORAL_TABLET | Freq: Once | ORAL | Status: AC
  Administered 2019-03-10: 0.2 mg via ORAL

## 2019-03-10 MED ORDER — OMEPRAZOLE 20 MG PO CPDR: 20.0000 mg | DELAYED_RELEASE_CAPSULE | Freq: Every day | ORAL | 5 refills | Status: DC

## 2019-03-10 MED ORDER — METHOCARBAMOL 500 MG PO TABS: 1000.0000 mg | ORAL_TABLET | Freq: Every evening | ORAL | 2 refills | Status: AC | PRN

## 2019-03-10 MED ORDER — ATORVASTATIN CALCIUM 20 MG PO TABS: 20.0000 mg | ORAL_TABLET | Freq: Every day | ORAL | 1 refills | Status: DC

## 2019-03-10 MED ORDER — CARVEDILOL 6.25 MG PO TABS: 6.2500 mg | ORAL_TABLET | Freq: Two times a day (BID) | ORAL | 1 refills | Status: DC

## 2019-03-10 MED ORDER — LOSARTAN POTASSIUM 100 MG PO TABS: 100.0000 mg | ORAL_TABLET | Freq: Every day | ORAL | 1 refills | Status: DC

## 2019-03-10 MED FILL — metFORMIN HCL 500 MG TABS: 500 | 30 days supply | Qty: 30 | Fill #0

## 2019-03-10 MED FILL — METHOCARBAMOL 500 MG TABS: 500 | 30 days supply | Qty: 60 | Fill #0

## 2019-03-10 MED FILL — ATORVASTATIN CALCIUM 20 MG: 20 | 30 days supply | Qty: 30 | Fill #0

## 2019-03-10 MED FILL — LOSARTAN POTASSIUM 100 MG T: 100 | 30 days supply | Qty: 30 | Fill #0

## 2019-03-10 MED FILL — OMEPRAZOLE 20 MG CAP: 20 | 30 days supply | Qty: 30 | Fill #0

## 2019-03-10 MED FILL — CARVEDILOL 6.25 MG TABLET: 6.25 | 30 days supply | Qty: 60 | Fill #0

## 2019-03-10 NOTE — Progress Notes (Signed)
Subjective:  Patient ID: Fred Brown, male    DOB: Oct 04, 1965  Age: 54 y.o. MRN: [id number]  CC: Diabetes and Hypertension   HPI Darrell Hauk is a 54 year old man with a history of HTN, hyperlipidemia, Type 2 DM (A1c 6.2), TIA, ascending aortic aneurysm who comes into the clinic for follow-up visit accompanied by [facility] interpreter His blood pressure is severely elevated and he has been out of his antihypertensives for the last 1 month.  He denies headaches, chest pain, dyspnea, blurry vision, weakness in extremities.  With regards to his diabetes mellitus he denies presence of hypoglycemia.  Has also been out of his diabetic medication. He does not have any statin and so has not been compliant with his atorvastatin. Last CT angiogram of the chest was done in 02/19/2017 which revealed stability of the diameter of his ascending aorta at 4.2 cm. CT angiogram of the chest from 01/2017 revealed: IMPRESSION: Stable aneurysmal dilatation of the ascending aorta at 4.2 cm. There is no evidence of dissection or intramural hematoma. Recommend annual imaging followup by CTA or MRA.   He has a 3/10 back pain which has been intermittent for about 4 weeks worse with change of position. His job involves cutting trees.  Denies presence of numbness in extremities.  Past Medical History:  Diagnosis Date  . Ascending aorta dilatation (HCC) 02/25/2015  . Essential hypertension 02/25/2015  . History of TIA (transient ischemic attack) 03/05/2015  . Type 2 diabetes mellitus ([place]) 03/06/2015    No past surgical history on file.  No family history on file.  No Known Allergies  Outpatient Medications Prior to Visit  Medication Sig Dispense Refill  . aspirin 325 MG tablet Take 1 tablet (325 mg total) by mouth daily. 30 tablet 3  . atorvastatin (LIPITOR) 20 MG tablet Take 1 tablet (20 mg total) by mouth daily at 6 PM. 90 tablet 1  . Blood Glucose Monitoring Suppl (ONE TOUCH ULTRA 2) w/Device KIT Use as directed 1  each 0  . carvedilol (COREG) 6.25 MG tablet Take 1 tablet (6.25 mg total) by mouth 2 (two) times daily with a meal. 180 tablet 1  . glucose blood (ONE TOUCH ULTRA TEST) test strip Use as instructed 30 each 5  . Lancets (ONETOUCH ULTRASOFT) lancets Use as instructed 100 each 12  . losartan (COZAAR) 100 MG tablet Take 1 tablet (100 mg total) by mouth daily. Make appt for more refills! 90 tablet 1  . metFORMIN (GLUCOPHAGE) 500 MG tablet Take 1 tablet (500 mg total) by mouth daily with breakfast. Make appt for more refills! 90 tablet 1  . methocarbamol (ROBAXIN) 500 MG tablet Take 2 tablets (1,000 mg total) by mouth at bedtime as needed for muscle spasms. 60 tablet 2  . Olopatadine HCl (PATADAY) 0.2 % SOLN PLACE 1 DROP INTO BOTH EYES 2(TWO) TIMES DAILY. 2.5 mL 1  . omeprazole (PRILOSEC) 20 MG capsule Take 1 capsule (20 mg total) by mouth daily. 30 capsule 5   No facility-administered medications prior to visit.     ROS Review of Systems  Constitutional: Negative for activity change and appetite change.  HENT: Negative for sinus pressure and sore throat.   Eyes: Negative for visual disturbance.  Respiratory: Negative for cough, chest tightness and shortness of breath.   Cardiovascular: Negative for chest pain and leg swelling.  Gastrointestinal: Negative for abdominal distention, abdominal pain, constipation and diarrhea.  Endocrine: Negative.   Genitourinary: Negative for dysuria.  Musculoskeletal: Positive for back  pain. Negative for joint swelling and myalgias.  Skin: Negative for rash.  Allergic/Immunologic: Negative.   Neurological: Negative for weakness, light-headedness and numbness.  Psychiatric/Behavioral: Negative for dysphoric mood and suicidal ideas.    Objective:  BP (!) 196/140   Pulse 88   Ht '5\' 3"'  (1.6 m)   Wt 166 lb 3.2 oz (75.4 kg)   SpO2 99%   BMI 29.44 kg/m   BP/Weight 03/10/2019 05/07/2017 6/50/3546  Systolic BP 568 127 517  Diastolic BP 001 90 95  Wt. (Lbs)  166.2 158.2 161.6  BMI 29.44 28.02 28.63      Physical Exam Constitutional:      Appearance: He is well-developed.  Neck:     Vascular: No JVD.  Cardiovascular:     Rate and Rhythm: Normal rate.     Heart sounds: Normal heart sounds. No murmur.  Pulmonary:     Effort: Pulmonary effort is normal.     Breath sounds: Normal breath sounds. No wheezing or rales.  Chest:     Chest wall: No tenderness.  Abdominal:     General: Bowel sounds are normal. There is no distension.     Palpations: Abdomen is soft. There is no mass.     Tenderness: There is no abdominal tenderness.  Musculoskeletal:        General: Normal range of motion.     Right lower leg: No edema.     Left lower leg: No edema.     Comments: Slight lumbar tenderness on palpation of spine Negative straight leg raise bilaterally  Neurological:     Mental Status: He is alert and oriented to person, place, and time.     Motor: No weakness.     Coordination: Coordination normal.     Gait: Gait normal.  Psychiatric:        Mood and Affect: Mood normal.     CMP Latest Ref Rng & Units 05/22/2018 05/07/2017 02/19/2017  Glucose 65 - 99 mg/dL 158(H) 109(H) -  BUN 6 - 24 mg/dL 14 16 -  Creatinine 0.76 - 1.27 mg/dL 0.98 0.89 0.80  Sodium 134 - 144 mmol/L 141 139 -  Potassium 3.5 - 5.2 mmol/L 4.0 4.1 -  Chloride 96 - 106 mmol/L 102 102 -  CO2 20 - 29 mmol/L 23 21 -  Calcium 8.7 - 10.2 mg/dL 9.3 9.2 -  Total Protein 6.0 - 8.5 g/dL 7.7 7.6 -  Total Bilirubin 0.0 - 1.2 mg/dL 0.6 0.5 -  Alkaline Phos 39 - 117 IU/L 96 94 -  AST 0 - 40 IU/L 15 17 -  ALT 0 - 44 IU/L 20 24 -    Lipid Panel     Component Value Date/Time   CHOL 136 05/07/2017 0955   TRIG 81 05/07/2017 0955   HDL 42 05/07/2017 0955   CHOLHDL 3.2 05/07/2017 0955   CHOLHDL 3.3 06/24/2015 0850   VLDL 19 06/24/2015 0850   LDLCALC 78 05/07/2017 0955    CBC    Component Value Date/Time   WBC 8.5 02/25/2015 0953   RBC 5.70 02/25/2015 0953   HGB 14.5  02/25/2015 0953   HCT 42.5 02/25/2015 0953   PLT 351 02/25/2015 0953   MCV 74.6 (L) 02/25/2015 0953   MCH 25.4 (L) 02/25/2015 0953   MCHC 34.1 02/25/2015 0953   RDW 12.8 02/25/2015 0953   LYMPHSABS 2.7 07/15/2009 1019   MONOABS 0.5 07/15/2009 1019   EOSABS 0.6 07/15/2009 1019   BASOSABS 0.0 07/15/2009 1019  Lab Results  Component Value Date   HGBA1C 6.2 05/07/2017    Assessment & Plan:  1. Type 2 diabetes mellitus without complication, without long-term current use of insulin (HCC) Controlled with A1c of 6.2 Counseled on Diabetic diet, my plate method, 295 minutes of moderate intensity exercise/week Blood sugar logs with fasting goals of 80-120 mg/dl, random of less than 180 and in the event of sugars less than 60 mg/dl or greater than 400 mg/dl encouraged to notify the clinic. Advised on the need for annual eye exams, annual foot exams, Pneumonia vaccine. - POCT glucose (manual entry) - POCT glycosylated hemoglobin (Hb A1C) - Ambulatory referral to Ophthalmology - metFORMIN (GLUCOPHAGE) 500 MG tablet; Take 1 tablet (500 mg total) by mouth daily with breakfast. Make appt for more refills!  Dispense: 90 tablet; Refill: 1 - CMP14+EGFR - Lipid panel - Microalbumin / creatinine urine ratio  2. Hyperlipidemia associated with type 2 diabetes mellitus ([place]) If uncontrolled we will make no regimen changes as he has been out of his statin - atorvastatin (LIPITOR) 20 MG tablet; Take 1 tablet (20 mg total) by mouth daily at 6 PM.  Dispense: 90 tablet; Refill: 1  3. Accelerated Hypertension Severely elevated blood pressure with high risk of progression to endorgan damage. At this time he is stable so we can manage him in the clinic. Clonidine 0.2 mg administered stat -discussed sedating side effects He was observed for 35 minutes and observed in the clinic. Blood pressure repeated returned at 160/110. All antihypertensives were refilled and he was advised to pick up prescription from  the pharmacy Counseled on blood pressure goal of less than 130/80, low-sodium, DASH diet, medication compliance, 150 minutes of moderate intensity exercise per week. Discussed medication compliance, adverse effects. - carvedilol (COREG) 6.25 MG tablet; Take 1 tablet (6.25 mg total) by mouth 2 (two) times daily with a meal.  Dispense: 180 tablet; Refill: 1 - losartan (COZAAR) 100 MG tablet; Take 1 tablet (100 mg total) by mouth daily. Make appt for more refills!  Dispense: 90 tablet; Refill: 1 - cloNIDine (CATAPRES) tablet 0.2 mg  4. Musculoskeletal back pain New onset Likely due to the fact that his work is very physical and labor-intensive Advised to apply heat We will prescribe methocarbamol - methocarbamol (ROBAXIN) 500 MG tablet; Take 2 tablets (1,000 mg total) by mouth at bedtime as needed for muscle spasms.  Dispense: 60 tablet; Refill: 2  5. Gastroesophageal reflux disease with esophagitis Controlled - omeprazole (PRILOSEC) 20 MG capsule; Take 1 capsule (20 mg total) by mouth daily.  Dispense: 30 capsule; Refill: 5  6. Screening for colon cancer - Ambulatory referral to Gastroenterology  7. Thoracic aortic aneurysm without rupture (HCC) We will refer for CT angiogram of the chest at next visit       Charlott Rakes, MD, FAAFP. [facility] and [place], [place] [place]   03/10/2019, 10:15 AM

## 2019-03-10 NOTE — Progress Notes (Signed)
Patient has had no medication in 1 month.

## 2019-03-11 LAB — CMP14+EGFR
ALT: 20 IU/L (ref 0–44)
AST: 15 IU/L (ref 0–40)
Albumin/Globulin Ratio: 1.6 (ref 1.2–2.2)
Albumin: 4.7 g/dL (ref 3.8–4.9)
Alkaline Phosphatase: 89 IU/L (ref 39–117)
BUN/Creatinine Ratio: 16 (ref 9–20)
BUN: 14 mg/dL (ref 6–24)
Bilirubin Total: 0.5 mg/dL (ref 0.0–1.2)
CO2: 21 mmol/L (ref 20–29)
Calcium: 9.3 mg/dL (ref 8.7–10.2)
Chloride: 104 mmol/L (ref 96–106)
Creatinine, Ser: 0.9 mg/dL (ref 0.76–1.27)
GFR calc Af Amer: 113 mL/min/{1.73_m2} (ref >59)
GFR calc non Af Amer: 98 mL/min/{1.73_m2} (ref >59)
Globulin, Total: 2.9 g/dL (ref 1.5–4.5)
Glucose: 127 mg/dL — ABNORMAL HIGH (ref 65–99)
Potassium: 4.1 mmol/L (ref 3.5–5.2)
Sodium: 141 mmol/L (ref 134–144)
Total Protein: 7.6 g/dL (ref 6.0–8.5)

## 2019-03-11 LAB — LIPID PANEL
Chol/HDL Ratio: 4.9 ratio (ref 0.0–5.0)
Cholesterol, Total: 194 mg/dL (ref 100–199)
HDL: 40 mg/dL (ref >39)
LDL Chol Calc (NIH): 130 mg/dL — ABNORMAL HIGH (ref 0–99)
Triglycerides: 132 mg/dL (ref 0–149)
VLDL Cholesterol Cal: 24 mg/dL (ref 5–40)

## 2019-03-11 LAB — MICROALBUMIN / CREATININE URINE RATIO
Creatinine, Urine: 227.5 mg/dL
Microalb/Creat Ratio: 16 mg/g creat (ref 0–29)
Microalbumin, Urine: 37.1 ug/mL

## 2019-03-28 ENCOUNTER — Other Ambulatory Visit: Payer: Self-pay

## 2019-03-28 ENCOUNTER — Ambulatory Visit (AMBULATORY_SURGERY_CENTER): Payer: Self-pay | Admitting: *Deleted

## 2019-03-28 VITALS — Temp 97.7°F | Ht 63.0 in

## 2019-03-28 DIAGNOSIS — Z1211 Encounter for screening for malignant neoplasm of colon: Secondary | ICD-10-CM

## 2019-03-28 DIAGNOSIS — Z01818 Encounter for other preprocedural examination: Secondary | ICD-10-CM

## 2019-03-28 NOTE — Progress Notes (Signed)

## 2019-03-28 NOTE — Progress Notes (Signed)
Patient assisted in appointment from  interpretor Risuin from [employer].

## 2019-04-14 MED FILL — ATORVASTATIN CALCIUM 20 MG: 20 | 30 days supply | Qty: 30 | Fill #1

## 2019-04-14 MED FILL — OMEPRAZOLE 20 MG CAP: 20 | 30 days supply | Qty: 30 | Fill #1

## 2019-04-14 MED FILL — metFORMIN HCL 500 MG TABS: 500 | 30 days supply | Qty: 30 | Fill #1

## 2019-04-14 MED FILL — LOSARTAN POTASSIUM 100 MG T: 100 | 30 days supply | Qty: 30 | Fill #1

## 2019-04-15 MED FILL — CARVEDILOL 6.25 MG TABLET: 6.25 | 30 days supply | Qty: 60 | Fill #1

## 2019-04-18 ENCOUNTER — Encounter: Payer: BC Managed Care – PPO | Admitting: Gastroenterology

## 2019-05-19 MED FILL — LOSARTAN POTASSIUM 100 MG T: 100 | 30 days supply | Qty: 30 | Fill #2

## 2019-05-19 MED FILL — METFORMIN HCL 500 MG TABS: 500 | 30 days supply | Qty: 30 | Fill #2

## 2019-05-19 MED FILL — OMEPRAZOLE 20 MG CAP: 20 | 30 days supply | Qty: 30 | Fill #2

## 2019-05-19 MED FILL — CARVEDILOL 6.25 MG TABLET: 6.25 | 30 days supply | Qty: 60 | Fill #2

## 2019-06-20 MED FILL — OMEPRAZOLE 20 MG CAP: 20 | 30 days supply | Qty: 30 | Fill #3

## 2019-06-20 MED FILL — LOSARTAN POTASSIUM 100 MG T: 100 | 30 days supply | Qty: 30 | Fill #3

## 2019-06-20 MED FILL — CARVEDILOL 6.25 MG TABLET: 6.25 | 30 days supply | Qty: 60 | Fill #3

## 2019-06-20 MED FILL — ATORVASTATIN CALCIUM 20 MG: 20 | 30 days supply | Qty: 30 | Fill #2

## 2019-08-11 ENCOUNTER — Encounter: Payer: Self-pay | Admitting: Family Medicine

## 2019-08-11 ENCOUNTER — Other Ambulatory Visit: Payer: Self-pay

## 2019-08-11 ENCOUNTER — Other Ambulatory Visit: Payer: Self-pay | Admitting: Family Medicine

## 2019-08-11 ENCOUNTER — Ambulatory Visit: Payer: BC Managed Care – PPO | Attending: Family Medicine | Admitting: Family Medicine

## 2019-08-11 VITALS — BP 158/110 | HR 79 | Ht 63.0 in | Wt 163.6 lb

## 2019-08-11 DIAGNOSIS — Z1159 Encounter for screening for other viral diseases: Secondary | ICD-10-CM

## 2019-08-11 DIAGNOSIS — Z1211 Encounter for screening for malignant neoplasm of colon: Secondary | ICD-10-CM | POA: Diagnosis not present

## 2019-08-11 DIAGNOSIS — I10 Essential (primary) hypertension: Secondary | ICD-10-CM | POA: Diagnosis not present

## 2019-08-11 DIAGNOSIS — I712 Thoracic aortic aneurysm, without rupture, unspecified: Secondary | ICD-10-CM

## 2019-08-11 DIAGNOSIS — E1169 Type 2 diabetes mellitus with other specified complication: Secondary | ICD-10-CM

## 2019-08-11 DIAGNOSIS — E119 Type 2 diabetes mellitus without complications: Secondary | ICD-10-CM

## 2019-08-11 DIAGNOSIS — E785 Hyperlipidemia, unspecified: Secondary | ICD-10-CM

## 2019-08-11 DIAGNOSIS — K2100 Gastro-esophageal reflux disease with esophagitis, without bleeding: Secondary | ICD-10-CM

## 2019-08-11 LAB — GLUCOSE, POCT (MANUAL RESULT ENTRY): POC Glucose: 123 mg/dl — AB (ref 70–99)

## 2019-08-11 LAB — POCT GLYCOSYLATED HEMOGLOBIN (HGB A1C): HbA1c, POC (controlled diabetic range): 6.8 % (ref 0.0–7.0)

## 2019-08-11 MED ORDER — OMEPRAZOLE 20 MG PO CPDR: 20.0000 mg | DELAYED_RELEASE_CAPSULE | Freq: Every day | ORAL | 1 refills | Status: DC

## 2019-08-11 MED ORDER — CARVEDILOL 6.25 MG PO TABS: 6.2500 mg | ORAL_TABLET | Freq: Two times a day (BID) | ORAL | 1 refills | Status: DC

## 2019-08-11 MED ORDER — LOSARTAN POTASSIUM-HCTZ 100-25 MG PO TABS: 1.0000 | ORAL_TABLET | Freq: Every day | ORAL | 1 refills | Status: DC

## 2019-08-11 MED ORDER — ATORVASTATIN CALCIUM 20 MG PO TABS: 20.0000 mg | ORAL_TABLET | Freq: Every day | ORAL | 1 refills | Status: DC

## 2019-08-11 MED ORDER — METFORMIN HCL 500 MG PO TABS: 500.0000 mg | ORAL_TABLET | Freq: Every day | ORAL | 1 refills | Status: DC

## 2019-08-11 MED FILL — CARVEDILOL 6.25 MG TABLET: 6.25 | 30 days supply | Qty: 60 | Fill #0

## 2019-08-11 MED FILL — OMEPRAZOLE 20 MG CAP: 20 | 30 days supply | Qty: 30 | Fill #0

## 2019-08-11 MED FILL — ATORVASTATIN CALCIUM 20 MG: 20 | 30 days supply | Qty: 30 | Fill #0

## 2019-08-11 MED FILL — LOSARTAN-HCTZ 100-25 MG TAB: 100-25 | 30 days supply | Qty: 30 | Fill #0

## 2019-08-11 MED FILL — METFORMIN HCL 500 MG TABS: 500 | 30 days supply | Qty: 30 | Fill #0

## 2019-08-11 NOTE — Progress Notes (Signed)
Subjective:  Patient ID: Fred Brown, male    DOB: 08-Jul-1965  Age: 54 y.o. MRN: [id number]  CC: Diabetes   HPI Yuval Rubens is a 54year old man with a history of HTN, hyperlipidemia, Type 2 DM (A1c 6.2), TIA, ascending aortic aneurysm who comes into the clinic for follow-up visit accompanied by [facility] interpreter  He is wondering if his antihypertensive can be changed as his BP is "too high sometimes and too low at other times". At home BP was 138/86 but in the clinic it is 150/110 and he endorses taking his antihypertensive.  At his last office visit his systolic blood pressure was in the 190s but of note he had been out of his antihypertensives for 1 month. Endorses compliance with his statin. He denies presence of chest pain, dyspnea and has not been seen by cardiac surgery recently for follow-up of his thoracic aortic aneurysm. He works out of town which has made it difficult for him to schedule his CTA chest to monitor his thoracic anuerysm.   Last imaging in 01/2017 revealed: IMPRESSION: Stable aneurysmal dilatation of the ascending aorta at 4.2 cm. There is no evidence of dissection or intramural hematoma. Recommend annual imaging followup by CTA or MRA. Past Medical History:  Diagnosis Date  . Ascending aorta dilatation (HCC) 02/25/2015  . Essential hypertension 02/25/2015  . History of TIA (transient ischemic attack) 03/05/2015  . Sleep apnea    no cpap  . Stroke ([place])    tia  . Type 2 diabetes mellitus ([place]) 03/06/2015    History reviewed. No pertinent surgical history.  Family History  Problem Relation Age of Onset  . Colon cancer Neg Hx   . Esophageal cancer Neg Hx   . Stomach cancer Neg Hx   . Rectal cancer Neg Hx     No Known Allergies  Outpatient Medications Prior to Visit  Medication Sig Dispense Refill  . aspirin 325 MG tablet Take 1 tablet (325 mg total) by mouth daily. 30 tablet 3  . Blood Glucose Monitoring Suppl (ONE TOUCH ULTRA 2) w/Device KIT Use as  directed 1 each 0  . glucose blood (ONE TOUCH ULTRA TEST) test strip Use as instructed 30 each 5  . Lancets (ONETOUCH ULTRASOFT) lancets Use as instructed 100 each 12  . Olopatadine HCl (PATADAY) 0.2 % SOLN PLACE 1 DROP INTO BOTH EYES 2(TWO) TIMES DAILY. 2.5 mL 1  . Polyethylene Glycol 3350 (MIRALAX PO) Take 238 g by mouth once. Colonoscopy bowel prep    . atorvastatin (LIPITOR) 20 MG tablet Take 1 tablet (20 mg total) by mouth daily at 6 PM. 90 tablet 1  . carvedilol (COREG) 6.25 MG tablet Take 1 tablet (6.25 mg total) by mouth 2 (two) times daily with a meal. 180 tablet 1  . losartan (COZAAR) 100 MG tablet Take 1 tablet (100 mg total) by mouth daily. Make appt for more refills! 90 tablet 1  . metFORMIN (GLUCOPHAGE) 500 MG tablet Take 1 tablet (500 mg total) by mouth daily with breakfast. Make appt for more refills! 90 tablet 1  . omeprazole (PRILOSEC) 20 MG capsule Take 1 capsule (20 mg total) by mouth daily. 30 capsule 5  . methocarbamol (ROBAXIN) 500 MG tablet Take 2 tablets (1,000 mg total) by mouth at bedtime as needed for muscle spasms. (Patient not taking: Reported on 08/11/2019) 60 tablet 2   No facility-administered medications prior to visit.     ROS Review of Systems  Constitutional: Negative for activity change and  appetite change.  HENT: Negative for sinus pressure and sore throat.   Eyes: Negative for visual disturbance.  Respiratory: Negative for cough, chest tightness and shortness of breath.   Cardiovascular: Negative for chest pain and leg swelling.  Gastrointestinal: Negative for abdominal distention, abdominal pain, constipation and diarrhea.  Endocrine: Negative.   Genitourinary: Negative for dysuria.  Musculoskeletal: Negative for joint swelling and myalgias.  Skin: Negative for rash.  Allergic/Immunologic: Negative.   Neurological: Negative for weakness, light-headedness and numbness.  Psychiatric/Behavioral: Negative for dysphoric mood and suicidal ideas.     Objective:  BP (!) 158/110   Pulse 79   Ht '5\' 3"'  (1.6 m)   Wt 163 lb 9.6 oz (74.2 kg)   SpO2 99%   BMI 28.98 kg/m   BP/Weight 08/11/2019 0/76/8088 01/30/313  Systolic BP 945 - 859  Diastolic BP 292 - 446  Wt. (Lbs) 163.6 - 166.2  BMI 28.98 29.44 29.44      Physical Exam Constitutional:      Appearance: He is well-developed.  Neck:     Vascular: No JVD.  Cardiovascular:     Rate and Rhythm: Normal rate.     Heart sounds: Normal heart sounds. No murmur heard.   Pulmonary:     Effort: Pulmonary effort is normal.     Breath sounds: Normal breath sounds. No wheezing or rales.  Chest:     Chest wall: No tenderness.  Abdominal:     General: Bowel sounds are normal. There is no distension.     Palpations: Abdomen is soft. There is no mass.     Tenderness: There is no abdominal tenderness.  Musculoskeletal:        General: Normal range of motion.     Right lower leg: No edema.     Left lower leg: No edema.  Neurological:     Mental Status: He is alert and oriented to person, place, and time.  Psychiatric:        Mood and Affect: Mood normal.     CMP Latest Ref Rng & Units 03/10/2019 05/22/2018 05/07/2017  Glucose 65 - 99 mg/dL 127(H) 158(H) 109(H)  BUN 6 - 24 mg/dL '14 14 16  ' Creatinine 0.76 - 1.27 mg/dL 0.90 0.98 0.89  Sodium 134 - 144 mmol/L 141 141 139  Potassium 3.5 - 5.2 mmol/L 4.1 4.0 4.1  Chloride 96 - 106 mmol/L 104 102 102  CO2 20 - 29 mmol/L '21 23 21  ' Calcium 8.7 - 10.2 mg/dL 9.3 9.3 9.2  Total Protein 6.0 - 8.5 g/dL 7.6 7.7 7.6  Total Bilirubin 0.0 - 1.2 mg/dL 0.5 0.6 0.5  Alkaline Phos 39 - 117 IU/L 89 96 94  AST 0 - 40 IU/L '15 15 17  ' ALT 0 - 44 IU/L '20 20 24    ' Lipid Panel     Component Value Date/Time   CHOL 194 03/10/2019 1115   TRIG 132 03/10/2019 1115   HDL 40 03/10/2019 1115   CHOLHDL 4.9 03/10/2019 1115   CHOLHDL 3.3 06/24/2015 0850   VLDL 19 06/24/2015 0850   LDLCALC 130 (H) 03/10/2019 1115    CBC    Component Value Date/Time    WBC 8.5 02/25/2015 0953   RBC 5.70 02/25/2015 0953   HGB 14.5 02/25/2015 0953   HCT 42.5 02/25/2015 0953   PLT 351 02/25/2015 0953   MCV 74.6 (L) 02/25/2015 0953   MCH 25.4 (L) 02/25/2015 0953   MCHC 34.1 02/25/2015 0953   RDW 12.8 02/25/2015 0953  LYMPHSABS 2.7 07/15/2009 1019   MONOABS 0.5 07/15/2009 1019   EOSABS 0.6 07/15/2009 1019   BASOSABS 0.0 07/15/2009 1019    Lab Results  Component Value Date   HGBA1C 6.8 08/11/2019    The 10-year ASCVD risk score Mikey Bussing DC Jr., et al., 2013) is: 15.7%   Values used to calculate the score:     Age: 73 years     Sex: Male     Is Non-Hispanic African American: No     Diabetic: Yes     Tobacco smoker: No     Systolic Blood Pressure: 782 mmHg     Is BP treated: Yes     HDL Cholesterol: 40 mg/dL     Total Cholesterol: 194 mg/dL  Assessment & Plan:    1. Type 2 diabetes mellitus without complication, without long-term current use of insulin (HCC) Controlled with A1c of 6.8 Continue current regimen Counseled on Diabetic diet, my plate method, 423 minutes of moderate intensity exercise/week Blood sugar logs with fasting goals of 80-120 mg/dl, random of less than 180 and in the event of sugars less than 60 mg/dl or greater than 400 mg/dl encouraged to notify the clinic. Advised on the need for annual eye exams, annual foot exams, Pneumonia vaccine. - POCT glucose (manual entry) - POCT glycosylated hemoglobin (Hb A1C) - metFORMIN (GLUCOPHAGE) 500 MG tablet; Take 1 tablet (500 mg total) by mouth daily with breakfast.  Dispense: 90 tablet; Refill: 1  2. Colon cancer screening - Cologuard  3. Hyperlipidemia associated with type 2 diabetes mellitus ([place] [facility] [place]) Uncontrolled from last set of blood work however he has been out of his medications for 1 month Lipid panel at next visit and will adjust regimen accordingly - atorvastatin (LIPITOR) 20 MG tablet; Take 1 tablet (20 mg total) by mouth daily at 6 PM.  Dispense: 90 tablet; Refill: 1  4.  Essential hypertension Uncontrolled Switch from losartan to losartan/HCTZ Check basic metabolic panel in 2 weeks Counseled on blood pressure goal of less than 130/80, low-sodium, DASH diet, medication compliance, 150 minutes of moderate intensity exercise per week. Discussed medication compliance, adverse effects. - carvedilol (COREG) 6.25 MG tablet; Take 1 tablet (6.25 mg total) by mouth 2 (two) times daily with a meal.  Dispense: 180 tablet; Refill: 1 - Basic Metabolic Panel; Future - CMP14+EGFR  5. Gastroesophageal reflux disease with esophagitis Controlled - omeprazole (PRILOSEC) 20 MG capsule; Take 1 capsule (20 mg total) by mouth daily.  Dispense: 90 capsule; Refill: 1  6. Thoracic aortic aneurysm without rupture (HCC) - CT Angio Chest W/Cm &/Or Wo Cm; Future  7. Need for hepatitis C screening test - HCV RNA quant rflx ultra or genotyp([facility])   Return in about 3 months (around 11/11/2019) for chronic disease management.   Charlott Rakes, MD, FAAFP. [facility] [place], [place]   08/11/2019, 12:21 PM

## 2019-08-12 LAB — CMP14+EGFR
ALT: 18 IU/L (ref 0–44)
AST: 16 IU/L (ref 0–40)
Albumin/Globulin Ratio: 1.6 (ref 1.2–2.2)
Albumin: 4.7 g/dL (ref 3.8–4.9)
Alkaline Phosphatase: 103 IU/L (ref 48–121)
BUN/Creatinine Ratio: 15 (ref 9–20)
BUN: 14 mg/dL (ref 6–24)
Bilirubin Total: 0.5 mg/dL (ref 0.0–1.2)
CO2: 20 mmol/L (ref 20–29)
Calcium: 9.4 mg/dL (ref 8.7–10.2)
Chloride: 103 mmol/L (ref 96–106)
Creatinine, Ser: 0.95 mg/dL (ref 0.76–1.27)
GFR calc Af Amer: 106 mL/min/{1.73_m2} (ref >59)
GFR calc non Af Amer: 92 mL/min/{1.73_m2} (ref >59)
Globulin, Total: 3 g/dL (ref 1.5–4.5)
Glucose: 118 mg/dL — ABNORMAL HIGH (ref 65–99)
Potassium: 4.7 mmol/L (ref 3.5–5.2)
Sodium: 139 mmol/L (ref 134–144)
Total Protein: 7.7 g/dL (ref 6.0–8.5)

## 2019-08-12 LAB — HCV RNA QUANT RFLX ULTRA OR GENOTYP: HCV Quant Baseline: NOT DETECTED IU/mL

## 2019-08-22 ENCOUNTER — Ambulatory Visit (HOSPITAL_COMMUNITY): Payer: BC Managed Care – PPO | Attending: Family Medicine

## 2019-08-29 ENCOUNTER — Encounter (HOSPITAL_COMMUNITY): Payer: Self-pay | Admitting: Emergency Medicine

## 2019-08-29 ENCOUNTER — Emergency Department (HOSPITAL_COMMUNITY): Payer: BC Managed Care – PPO

## 2019-08-29 ENCOUNTER — Emergency Department (HOSPITAL_COMMUNITY)
Admission: EM | Admit: 2019-08-29 | Discharge: 2019-08-29 | Disposition: A | Discharge status: Home / Self Care | Payer: BC Managed Care – PPO | Attending: Emergency Medicine | Admitting: Emergency Medicine

## 2019-08-29 ENCOUNTER — Other Ambulatory Visit: Payer: Self-pay

## 2019-08-29 DIAGNOSIS — M545 Low back pain, unspecified: Secondary | ICD-10-CM

## 2019-08-29 DIAGNOSIS — Y93I9 Activity, other involving external motion: Secondary | ICD-10-CM | POA: Insufficient documentation

## 2019-08-29 DIAGNOSIS — R0781 Pleurodynia: Secondary | ICD-10-CM | POA: Diagnosis present

## 2019-08-29 DIAGNOSIS — Z7982 Long term (current) use of aspirin: Secondary | ICD-10-CM | POA: Insufficient documentation

## 2019-08-29 DIAGNOSIS — Y999 Unspecified external cause status: Secondary | ICD-10-CM | POA: Diagnosis not present

## 2019-08-29 DIAGNOSIS — Z79899 Other long term (current) drug therapy: Secondary | ICD-10-CM | POA: Insufficient documentation

## 2019-08-29 DIAGNOSIS — E119 Type 2 diabetes mellitus without complications: Secondary | ICD-10-CM | POA: Insufficient documentation

## 2019-08-29 DIAGNOSIS — Z7984 Long term (current) use of oral hypoglycemic drugs: Secondary | ICD-10-CM | POA: Insufficient documentation

## 2019-08-29 DIAGNOSIS — Y92410 Unspecified street and highway as the place of occurrence of the external cause: Secondary | ICD-10-CM | POA: Insufficient documentation

## 2019-08-29 DIAGNOSIS — I10 Essential (primary) hypertension: Secondary | ICD-10-CM | POA: Insufficient documentation

## 2019-08-29 MED ORDER — NAPROXEN 250 MG PO TABS
500.0000 mg | ORAL_TABLET | Freq: Once | ORAL | Status: AC
  Administered 2019-08-29: 500 mg via ORAL
  Filled 2019-08-29: qty 2

## 2019-08-29 MED ORDER — NAPROXEN 500 MG PO TABS: 500.0000 mg | ORAL_TABLET | Freq: Two times a day (BID) | ORAL | 0 refills | Status: AC

## 2019-08-29 NOTE — Discharge Instructions (Signed)
Take Naproxen as needed for pain twice a day. Take this medicine with food Use a heating pad for sore muscles - use for 20 minutes several times a day Try gentle range of motion exercises Return for worsening symptoms
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
## 2019-08-29 NOTE — ED Notes (Signed)
Patient verbalizes understanding of discharge instructions. Opportunity for questioning and answers were provided. Armband removed by staff, pt discharged from ED ambulatory.   
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
## 2019-09-04 NOTE — Progress Notes (Signed)
This language is not available.

## 2019-10-08 MED FILL — ATORVASTATIN CALCIUM 20 MG: 20 | 30 days supply | Qty: 30 | Fill #1

## 2019-10-08 MED FILL — OMEPRAZOLE 20 MG CAP: 20 | 30 days supply | Qty: 30 | Fill #1

## 2019-10-08 MED FILL — CARVEDILOL 6.25 MG TABLET: 6.25 | 30 days supply | Qty: 60 | Fill #1

## 2019-10-08 MED FILL — METFORMIN HCL 500 MG TABS: 500 | 30 days supply | Qty: 30 | Fill #1

## 2019-10-08 MED FILL — LOSARTAN-HCTZ 100-25 MG TAB: 100-25 | 30 days supply | Qty: 30 | Fill #1

## 2019-11-11 ENCOUNTER — Ambulatory Visit: Payer: BC Managed Care – PPO | Admitting: Family Medicine

## 2019-12-09 MED FILL — METFORMIN HCL 500 MG TABS: 500 | 30 days supply | Qty: 30 | Fill #2

## 2019-12-09 MED FILL — ATORVASTATIN CALCIUM 20 MG: 20 | 30 days supply | Qty: 30 | Fill #2

## 2019-12-09 MED FILL — CARVEDILOL 6.25 MG TABLET: 6.25 | 30 days supply | Qty: 60 | Fill #2

## 2019-12-09 MED FILL — LOSARTAN-HCTZ 100-25 MG TAB: 100-25 | 30 days supply | Qty: 30 | Fill #2

## 2019-12-09 MED FILL — OMEPRAZOLE 20 MG CAP: 20 | 30 days supply | Qty: 30 | Fill #2

## 2020-03-04 ENCOUNTER — Inpatient Hospital Stay (HOSPITAL_COMMUNITY): Payer: BLUE CROSS/BLUE SHIELD

## 2020-03-04 ENCOUNTER — Emergency Department (HOSPITAL_COMMUNITY): Payer: BLUE CROSS/BLUE SHIELD

## 2020-03-04 ENCOUNTER — Encounter (HOSPITAL_COMMUNITY): Payer: Self-pay | Admitting: Neurology

## 2020-03-04 ENCOUNTER — Other Ambulatory Visit (HOSPITAL_COMMUNITY): Payer: BLUE CROSS/BLUE SHIELD

## 2020-03-04 ENCOUNTER — Inpatient Hospital Stay (HOSPITAL_COMMUNITY)
Admission: EM | Admit: 2020-03-04 | Discharge: 2020-03-30 | DRG: 064 | Disposition: E | Payer: BLUE CROSS/BLUE SHIELD | Attending: Neurology | Admitting: Neurology

## 2020-03-04 DIAGNOSIS — Z8673 Personal history of transient ischemic attack (TIA), and cerebral infarction without residual deficits: Secondary | ICD-10-CM

## 2020-03-04 DIAGNOSIS — G911 Obstructive hydrocephalus: Secondary | ICD-10-CM | POA: Diagnosis present

## 2020-03-04 DIAGNOSIS — J9602 Acute respiratory failure with hypercapnia: Secondary | ICD-10-CM | POA: Diagnosis present

## 2020-03-04 DIAGNOSIS — I161 Hypertensive emergency: Secondary | ICD-10-CM | POA: Diagnosis present

## 2020-03-04 DIAGNOSIS — G936 Cerebral edema: Secondary | ICD-10-CM | POA: Diagnosis present

## 2020-03-04 DIAGNOSIS — I614 Nontraumatic intracerebral hemorrhage in cerebellum: Principal | ICD-10-CM | POA: Diagnosis present

## 2020-03-04 DIAGNOSIS — I77810 Thoracic aortic ectasia: Secondary | ICD-10-CM | POA: Diagnosis present

## 2020-03-04 DIAGNOSIS — Z7982 Long term (current) use of aspirin: Secondary | ICD-10-CM

## 2020-03-04 DIAGNOSIS — E1165 Type 2 diabetes mellitus with hyperglycemia: Secondary | ICD-10-CM | POA: Diagnosis not present

## 2020-03-04 DIAGNOSIS — G4453 Primary thunderclap headache: Secondary | ICD-10-CM | POA: Diagnosis present

## 2020-03-04 DIAGNOSIS — Z7984 Long term (current) use of oral hypoglycemic drugs: Secondary | ICD-10-CM

## 2020-03-04 DIAGNOSIS — E876 Hypokalemia: Secondary | ICD-10-CM | POA: Diagnosis not present

## 2020-03-04 DIAGNOSIS — Z515 Encounter for palliative care: Secondary | ICD-10-CM

## 2020-03-04 DIAGNOSIS — R29738 NIHSS score 38: Secondary | ICD-10-CM | POA: Diagnosis present

## 2020-03-04 DIAGNOSIS — K219 Gastro-esophageal reflux disease without esophagitis: Secondary | ICD-10-CM | POA: Diagnosis present

## 2020-03-04 DIAGNOSIS — I959 Hypotension, unspecified: Secondary | ICD-10-CM | POA: Diagnosis present

## 2020-03-04 DIAGNOSIS — Z66 Do not resuscitate: Secondary | ICD-10-CM | POA: Diagnosis not present

## 2020-03-04 DIAGNOSIS — E119 Type 2 diabetes mellitus without complications: Secondary | ICD-10-CM | POA: Diagnosis present

## 2020-03-04 DIAGNOSIS — J9601 Acute respiratory failure with hypoxia: Secondary | ICD-10-CM | POA: Diagnosis present

## 2020-03-04 DIAGNOSIS — G935 Compression of brain: Secondary | ICD-10-CM | POA: Diagnosis present

## 2020-03-04 DIAGNOSIS — Z20822 Contact with and (suspected) exposure to covid-19: Secondary | ICD-10-CM | POA: Diagnosis present

## 2020-03-04 DIAGNOSIS — R4182 Altered mental status, unspecified: Secondary | ICD-10-CM | POA: Diagnosis present

## 2020-03-04 DIAGNOSIS — I619 Nontraumatic intracerebral hemorrhage, unspecified: Secondary | ICD-10-CM | POA: Diagnosis present

## 2020-03-04 DIAGNOSIS — R471 Dysarthria and anarthria: Secondary | ICD-10-CM | POA: Diagnosis present

## 2020-03-04 DIAGNOSIS — I719 Aortic aneurysm of unspecified site, without rupture: Secondary | ICD-10-CM | POA: Diagnosis present

## 2020-03-04 DIAGNOSIS — G4733 Obstructive sleep apnea (adult) (pediatric): Secondary | ICD-10-CM | POA: Diagnosis present

## 2020-03-04 DIAGNOSIS — I615 Nontraumatic intracerebral hemorrhage, intraventricular: Secondary | ICD-10-CM | POA: Diagnosis present

## 2020-03-04 DIAGNOSIS — I10 Essential (primary) hypertension: Secondary | ICD-10-CM | POA: Diagnosis present

## 2020-03-04 DIAGNOSIS — E785 Hyperlipidemia, unspecified: Secondary | ICD-10-CM | POA: Diagnosis present

## 2020-03-04 DIAGNOSIS — Z79899 Other long term (current) drug therapy: Secondary | ICD-10-CM

## 2020-03-04 LAB — URINALYSIS, ROUTINE W REFLEX MICROSCOPIC
Bilirubin Urine: NEGATIVE
Glucose, UA: 50 mg/dL — AB
Hgb urine dipstick: NEGATIVE
Ketones, ur: NEGATIVE mg/dL
Leukocytes,Ua: NEGATIVE
Nitrite: NEGATIVE
Protein, ur: 30 mg/dL — AB
Specific Gravity, Urine: 1.008 (ref 1.005–1.030)
pH: 6 (ref 5.0–8.0)

## 2020-03-04 LAB — DIFFERENTIAL
Abs Immature Granulocytes: 0 10*3/uL (ref 0.00–0.07)
Basophils Absolute: 0.2 10*3/uL — ABNORMAL HIGH (ref 0.0–0.1)
Basophils Relative: 1 %
Eosinophils Absolute: 0.3 10*3/uL (ref 0.0–0.5)
Eosinophils Relative: 2 %
Lymphocytes Relative: 61 %
Lymphs Abs: 9.5 10*3/uL — ABNORMAL HIGH (ref 0.7–4.0)
Monocytes Absolute: 0.6 10*3/uL (ref 0.1–1.0)
Monocytes Relative: 4 %
Neutro Abs: 5 10*3/uL (ref 1.7–7.7)
Neutrophils Relative %: 32 %
nRBC: 0 /100 WBC

## 2020-03-04 LAB — COMPREHENSIVE METABOLIC PANEL
ALT: 25 U/L (ref 0–44)
AST: 26 U/L (ref 15–41)
Albumin: 4.1 g/dL (ref 3.5–5.0)
Alkaline Phosphatase: 65 U/L (ref 38–126)
Anion gap: 13 (ref 5–15)
BUN: 14 mg/dL (ref 6–20)
CO2: 22 mmol/L (ref 22–32)
Calcium: 9.6 mg/dL (ref 8.9–10.3)
Chloride: 104 mmol/L (ref 98–111)
Creatinine, Ser: 1.12 mg/dL (ref 0.61–1.24)
GFR, Estimated: >60 mL/min (ref >60)
Glucose, Bld: 253 mg/dL — ABNORMAL HIGH (ref 70–99)
Potassium: 2.7 mmol/L — CL (ref 3.5–5.1)
Sodium: 139 mmol/L (ref 135–145)
Total Bilirubin: 0.8 mg/dL (ref 0.3–1.2)
Total Protein: 7.8 g/dL (ref 6.5–8.1)

## 2020-03-04 LAB — CBG MONITORING, ED: Glucose-Capillary: 248 mg/dL — ABNORMAL HIGH (ref 70–99)

## 2020-03-04 LAB — I-STAT ARTERIAL BLOOD GAS, ED
Acid-base deficit: 6 mmol/L — ABNORMAL HIGH (ref 0.0–2.0)
Bicarbonate: 19.7 mmol/L — ABNORMAL LOW (ref 20.0–28.0)
Calcium, Ion: 1.17 mmol/L (ref 1.15–1.40)
HCT: 30 % — ABNORMAL LOW (ref 39.0–52.0)
Hemoglobin: 10.2 g/dL — ABNORMAL LOW (ref 13.0–17.0)
O2 Saturation: 100 %
Potassium: 2.5 mmol/L — CL (ref 3.5–5.1)
Sodium: 146 mmol/L — ABNORMAL HIGH (ref 135–145)
TCO2: 21 mmol/L — ABNORMAL LOW (ref 22–32)
pCO2 arterial: 40.9 mmHg (ref 32.0–48.0)
pH, Arterial: 7.291 — ABNORMAL LOW (ref 7.350–7.450)
pO2, Arterial: 283 mmHg — ABNORMAL HIGH (ref 83.0–108.0)

## 2020-03-04 LAB — MRSA PCR SCREENING: MRSA by PCR: NEGATIVE

## 2020-03-04 LAB — I-STAT CHEM 8, ED
BUN: 19 mg/dL (ref 6–20)
Calcium, Ion: 1.1 mmol/L — ABNORMAL LOW (ref 1.15–1.40)
Chloride: 107 mmol/L (ref 98–111)
Creatinine, Ser: 0.9 mg/dL (ref 0.61–1.24)
Glucose, Bld: 259 mg/dL — ABNORMAL HIGH (ref 70–99)
HCT: 49 % (ref 39.0–52.0)
Hemoglobin: 16.7 g/dL (ref 13.0–17.0)
Potassium: 2.8 mmol/L — ABNORMAL LOW (ref 3.5–5.1)
Sodium: 139 mmol/L (ref 135–145)
TCO2: 21 mmol/L — ABNORMAL LOW (ref 22–32)

## 2020-03-04 LAB — RAPID URINE DRUG SCREEN, HOSP PERFORMED
Amphetamines: NOT DETECTED
Barbiturates: NOT DETECTED
Benzodiazepines: NOT DETECTED
Cocaine: NOT DETECTED
Opiates: NOT DETECTED
Tetrahydrocannabinol: NOT DETECTED

## 2020-03-04 LAB — PROTIME-INR
INR: 1 (ref 0.8–1.2)
Prothrombin Time: 12.3 seconds (ref 11.4–15.2)

## 2020-03-04 LAB — CBC
HCT: 48.4 % (ref 39.0–52.0)
Hemoglobin: 15.7 g/dL (ref 13.0–17.0)
MCH: 25.8 pg — ABNORMAL LOW (ref 26.0–34.0)
MCHC: 32.4 g/dL (ref 30.0–36.0)
MCV: 79.6 fL — ABNORMAL LOW (ref 80.0–100.0)
Platelets: 316 10*3/uL (ref 150–400)
RBC: 6.08 MIL/uL — ABNORMAL HIGH (ref 4.22–5.81)
RDW: 12.8 % (ref 11.5–15.5)
WBC: 15.6 10*3/uL — ABNORMAL HIGH (ref 4.0–10.5)
nRBC: 0 % (ref 0.0–0.2)

## 2020-03-04 LAB — TYPE AND SCREEN
ABO/RH(D): B POS
Antibody Screen: NEGATIVE

## 2020-03-04 LAB — LIPID PANEL
Cholesterol: 201 mg/dL — ABNORMAL HIGH (ref 0–200)
HDL: 42 mg/dL (ref >40)
LDL Cholesterol: 136 mg/dL — ABNORMAL HIGH (ref 0–99)
Total CHOL/HDL Ratio: 4.8 RATIO
Triglycerides: 115 mg/dL (ref <150)
VLDL: 23 mg/dL (ref 0–40)

## 2020-03-04 LAB — SODIUM
Sodium: 147 mmol/L — ABNORMAL HIGH (ref 135–145)
Sodium: 149 mmol/L — ABNORMAL HIGH (ref 135–145)

## 2020-03-04 LAB — ABO/RH: ABO/RH(D): B POS

## 2020-03-04 LAB — HEMOGLOBIN A1C
Hgb A1c MFr Bld: 7.5 % — ABNORMAL HIGH (ref 4.8–5.6)
Mean Plasma Glucose: 168.55 mg/dL

## 2020-03-04 LAB — TROPONIN I (HIGH SENSITIVITY): Troponin I (High Sensitivity): 7 ng/L (ref <18)

## 2020-03-04 LAB — SARS CORONAVIRUS 2 BY RT PCR (HOSPITAL ORDER, PERFORMED IN ~~LOC~~ HOSPITAL LAB): SARS Coronavirus 2: NEGATIVE

## 2020-03-04 LAB — PHOSPHORUS: Phosphorus: 2.6 mg/dL (ref 2.5–4.6)

## 2020-03-04 LAB — MAGNESIUM: Magnesium: 1.7 mg/dL (ref 1.7–2.4)

## 2020-03-04 LAB — APTT: aPTT: 24 seconds (ref 24–36)

## 2020-03-04 LAB — ETHANOL: Alcohol, Ethyl (B): <10 mg/dL (ref <10)

## 2020-03-04 MED ORDER — ONDANSETRON HCL 4 MG/2ML IJ SOLN
INTRAMUSCULAR | Status: AC
  Filled 2020-03-04: qty 2

## 2020-03-04 MED ORDER — ORAL CARE MOUTH RINSE
15.0000 mL | OROMUCOSAL | Status: DC
  Administered 2020-03-04 – 2020-03-06 (×16): 15 mL via OROMUCOSAL

## 2020-03-04 MED ORDER — NOREPINEPHRINE 4 MG/250ML-% IV SOLN
2.0000 ug/min | INTRAVENOUS | Status: DC
  Administered 2020-03-04: 10 ug/min via INTRAVENOUS

## 2020-03-04 MED ORDER — LEVETIRACETAM IN NACL 500 MG/100ML IV SOLN
500.0000 mg | Freq: Two times a day (BID) | INTRAVENOUS | Status: DC
  Administered 2020-03-04 – 2020-03-05 (×3): 500 mg via INTRAVENOUS
  Filled 2020-03-04 (×3): qty 100

## 2020-03-04 MED ORDER — LEVETIRACETAM IN NACL 1000 MG/100ML IV SOLN
1000.0000 mg | Freq: Once | INTRAVENOUS | Status: AC
  Administered 2020-03-04: 1000 mg via INTRAVENOUS
  Filled 2020-03-04: qty 100

## 2020-03-04 MED ORDER — ONDANSETRON HCL 4 MG/2ML IJ SOLN: 4.0000 mg | Freq: Four times a day (QID) | INTRAMUSCULAR | Status: DC | PRN

## 2020-03-04 MED ORDER — MANNITOL 20 % IV SOLN
50.0000 g | Freq: Four times a day (QID) | Status: DC
  Administered 2020-03-04: 50 g via INTRAVENOUS
  Filled 2020-03-04 (×5): qty 250

## 2020-03-04 MED ORDER — SENNOSIDES-DOCUSATE SODIUM 8.6-50 MG PO TABS
1.0000 | ORAL_TABLET | Freq: Two times a day (BID) | ORAL | Status: DC
  Administered 2020-03-04 – 2020-03-05 (×2): 1
  Filled 2020-03-04 (×2): qty 1

## 2020-03-04 MED ORDER — CHLORHEXIDINE GLUCONATE 0.12% ORAL RINSE (MEDLINE KIT)
15.0000 mL | Freq: Two times a day (BID) | OROMUCOSAL | Status: DC
  Administered 2020-03-04 – 2020-03-06 (×4): 15 mL via OROMUCOSAL

## 2020-03-04 MED ORDER — NOREPINEPHRINE 4 MG/250ML-% IV SOLN
0.0000 ug/min | INTRAVENOUS | Status: DC
  Filled 2020-03-04: qty 250

## 2020-03-04 MED ORDER — MANNITOL 20 % IV SOLN: 70.0000 g | Freq: Once | Status: DC

## 2020-03-04 MED ORDER — SODIUM CHLORIDE 3 % IV BOLUS
500.0000 mL | Freq: Once | INTRAVENOUS | Status: AC
  Administered 2020-03-04: 500 mL via INTRAVENOUS
  Filled 2020-03-04: qty 500

## 2020-03-04 MED ORDER — SODIUM CHLORIDE 0.9 % IV SOLN: 250.0000 mL | INTRAVENOUS | Status: DC

## 2020-03-04 MED ORDER — ACETAMINOPHEN 325 MG PO TABS: 650.0000 mg | ORAL_TABLET | ORAL | Status: DC | PRN

## 2020-03-04 MED ORDER — SODIUM CHLORIDE 23.4 % INJECTION (4 MEQ/ML) FOR IV ADMINISTRATION
120.0000 meq | Freq: Once | INTRAVENOUS | Status: DC
  Filled 2020-03-04: qty 30

## 2020-03-04 MED ORDER — CHLORHEXIDINE GLUCONATE CLOTH 2 % EX PADS
6.0000 | MEDICATED_PAD | Freq: Every day | CUTANEOUS | Status: DC
  Administered 2020-03-05 – 2020-03-06 (×2): 6 via TOPICAL

## 2020-03-04 MED ORDER — PANTOPRAZOLE SODIUM 40 MG IV SOLR
40.0000 mg | Freq: Every day | INTRAVENOUS | Status: DC
  Administered 2020-03-04 – 2020-03-05 (×2): 40 mg via INTRAVENOUS
  Filled 2020-03-04 (×2): qty 40

## 2020-03-04 MED ORDER — MANNITOL 20 % IV SOLN
50.0000 g | Freq: Four times a day (QID) | INTRAVENOUS | Status: DC
  Administered 2020-03-04 – 2020-03-05 (×3): 50 g via INTRAVENOUS
  Filled 2020-03-04 (×8): qty 250

## 2020-03-04 MED ORDER — STROKE: EARLY STAGES OF RECOVERY BOOK
Freq: Once | Status: DC
  Filled 2020-03-04: qty 1

## 2020-03-04 MED ORDER — MANNITOL 20 % IV SOLN
70.0000 g | Freq: Once | Status: AC
  Administered 2020-03-04: 70 g via INTRAVENOUS
  Filled 2020-03-04: qty 350

## 2020-03-04 MED ORDER — ACETAMINOPHEN 650 MG RE SUPP
650.0000 mg | RECTAL | Status: DC | PRN
  Administered 2020-03-05: 650 mg via RECTAL
  Filled 2020-03-04: qty 1

## 2020-03-04 MED ORDER — FENTANYL CITRATE (PF) 100 MCG/2ML IJ SOLN: 100.0000 ug | Freq: Once | INTRAMUSCULAR | Status: AC

## 2020-03-04 MED ORDER — ACETAMINOPHEN 160 MG/5ML PO SOLN
650.0000 mg | ORAL | Status: DC | PRN
  Administered 2020-03-05 – 2020-03-06 (×3): 650 mg
  Filled 2020-03-04 (×3): qty 20.3

## 2020-03-04 MED ORDER — POTASSIUM CHLORIDE 10 MEQ/100ML IV SOLN
10.0000 meq | INTRAVENOUS | Status: AC
  Administered 2020-03-04 (×2): 10 meq via INTRAVENOUS
  Filled 2020-03-04 (×2): qty 100

## 2020-03-04 MED ORDER — FENTANYL CITRATE (PF) 100 MCG/2ML IJ SOLN
INTRAMUSCULAR | Status: AC
  Administered 2020-03-04: 100 ug via INTRAVENOUS
  Filled 2020-03-04: qty 2

## 2020-03-04 MED ORDER — MANNITOL 20 % IV SOLN: 50.0000 g | Freq: Four times a day (QID) | Status: DC

## 2020-03-04 MED ORDER — SODIUM CHLORIDE 0.9 % IV BOLUS
1000.0000 mL | Freq: Once | INTRAVENOUS | Status: AC
  Administered 2020-03-04: 1000 mL via INTRAVENOUS

## 2020-03-04 MED ORDER — CLEVIDIPINE BUTYRATE 0.5 MG/ML IV EMUL
0.0000 mg/h | INTRAVENOUS | Status: DC
  Administered 2020-03-04: 1 mg/h via INTRAVENOUS

## 2020-03-04 MED ORDER — SODIUM CHLORIDE 3 % IV SOLN
INTRAVENOUS | Status: DC
  Administered 2020-03-04: 75 mL/h via INTRAVENOUS
  Filled 2020-03-04 (×4): qty 500

## 2020-03-04 MED ORDER — SODIUM CHLORIDE 0.9% FLUSH: 3.0000 mL | Freq: Once | INTRAVENOUS | Status: DC

## 2020-03-04 MED ORDER — NICARDIPINE HCL IN NACL 20-0.86 MG/200ML-% IV SOLN
3.0000 mg/h | INTRAVENOUS | Status: DC
  Administered 2020-03-04: 10 mg/h via INTRAVENOUS
  Administered 2020-03-04 (×2): 5 mg/h via INTRAVENOUS
  Administered 2020-03-05 (×2): 15 mg/h via INTRAVENOUS
  Administered 2020-03-05: 7.5 mg/h via INTRAVENOUS
  Administered 2020-03-05: 5 mg/h via INTRAVENOUS
  Administered 2020-03-06 (×2): 15 mg/h via INTRAVENOUS
  Administered 2020-03-06: 5 mg/h via INTRAVENOUS
  Administered 2020-03-06: 15 mg/h via INTRAVENOUS
  Administered 2020-03-06: 12.5 mg/h via INTRAVENOUS
  Filled 2020-03-04 (×2): qty 200
  Filled 2020-03-04: qty 400
  Filled 2020-03-04 (×7): qty 200

## 2020-03-04 NOTE — ED Notes (Addendum)
Neuro surgeon at bedside, updating wife.

## 2020-03-04 NOTE — ED Notes (Signed)
Neurologist at bedside. 

## 2020-03-04 NOTE — ED Notes (Signed)
Filter tubing applied to mannitol.

## 2020-03-04 NOTE — ED Notes (Signed)
MD paged due to patient K being elevated.

## 2020-03-04 NOTE — ED Notes (Signed)
Wife is at bedside.
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
## 2020-03-04 NOTE — Code Documentation (Signed)
Stroke Response Nurse Documentation Code Documentation  Fred Brown is a 55 y.o. male arriving to [facility] ED via [facility] on 03/17/2020 with past medical hx of stroke. Code stroke was activated by EMS. Patient from home where he was LKW at 0930 when family reported he complained of a "thunder clap" headache. Wife called EMS and EMS reported nausea and vomiting until patient went unresponsive during transport. An episode of apnea was noted and they attempted intubation without success.   Stroke team at the bedside on patient arrival. Patient taken immediat3ely to ED11 to be intubated due to actively being bagged. Labs drawn and patient stabilized by Dr. Adela Lank. Patient to CT with team after airway secured. NIHSS 38, see documentation for details and code stroke times. Patient with no response to pain in all extremities, not speaking or blinking to threat on exam. The following imaging was completed:  CT which revealed a large hemorrhage per MD Thomasena Edis. Patient is not a candidate for tPA due to hemorrhage. Care/Plan: BP < 140, 3% Saline started, Neurosurgery consult.  Returned patient from CT and BP noted to be 84/66 during transport. [facility] made aware. NS bolus started. Pt BP continued to decrease even with bolus. MD made aware and bolus set to pressure bag. Levophed ordered by provider. Started and patient BP stabilized. See MAR and VS.  Bedside handoff with ED RN Marylu Lund.    Lucila Maine  Stroke Response RN

## 2020-03-04 NOTE — ED Notes (Signed)
yes our goal is SBP<140 with an MAP>65  Per MD collins

## 2020-03-04 NOTE — ED Notes (Signed)
Lab called about pt bloodwork and stated they will work on it.

## 2020-03-04 NOTE — Consult Note (Signed)
NAME:  Fred Brown, MRN:  [id number], DOB:  10-Feb-1965, LOS: 0 ADMISSION DATE:  03/11/2020, CONSULTATION DATE: 03/16/2020 REFERRING MD: Lynnae Sandhoff MD, CHIEF COMPLAINT:  Cerebellar hemorrhage, vent management  Brief History:  55 year old with history of hypertension, TIA, diabetes, OSA not on CPAP presenting with acute cerebellar hemorrhage with intraventricular extension, mechanical ventilated due to altered mental status.  Not a candidate for intervention per neurosurgery.  He has poor prognosis for meaningful recovery but family is requesting full code. PCCM consulted for vent management  Past Medical History:    has a past medical history of Ascending aorta dilatation ([place]) (02/25/2015), Essential hypertension (02/25/2015), History of TIA (transient ischemic attack) (03/05/2015), Sleep apnea, Stroke [facility]), and Type 2 diabetes mellitus ([place]) (03/06/2015).  Significant Hospital Events:  2/3-admit  Consults:  PCCM, neurosurgery  Procedures:  ETT 2/3 >  Significant Diagnostic Tests:  CT head 03/24/2020-large cerebellar hematoma with diffuse intraventricular extension, cerebellar edema with mass-effect on the cerebellum and brainstem.  Obstructive hydrocephalus.  Micro Data:    Antimicrobials:    Interim History / Subjective:    Objective   Blood pressure 116/74, pulse (!) 107, temperature 99.9 F (37.7 C), temperature source Oral, resp. rate 19, height '5\' 6"'  (1.676 m), weight 77.8 kg, SpO2 100 %.    Vent Mode: PRVC FiO2 (%):  [50 %-100 %] 50 % Set Rate:  [16 bmp] 16 bmp Vt Set:  [500 mL-510 mL] 510 mL PEEP:  [5 cmH20] 5 cmH20 Plateau Pressure:  [18 cmH20] 18 cmH20   Intake/Output Summary (Last 24 hours) at 03/26/2020 2135 Last data filed at 03/24/2020 2000 Gross per 24 hour  Intake 1291.06 ml  Output 2775 ml  Net -1483.94 ml   Filed Weights   03/02/2020 1020  Weight: 77.8 kg    Examination: Blood pressure 119/74, pulse (!) 103, temperature 99.9 F (37.7 C), temperature  source Oral, resp. rate 20, height '5\' 6"'  (1.676 m), weight 77.8 kg, SpO2 100 %. Gen:      No acute distress HEENT:  EOMI, sclera anicteric Neck:     No masses; no thyromegaly, ETT Lungs:    Clear to auscultation bilaterally; normal respiratory effort CV:         Regular rate and rhythm; no murmurs Abd:      + bowel sounds; soft, non-tender; no palpable masses, no distension Ext:    No edema; adequate peripheral perfusion Skin:      Warm and dry; no rash Neuro: [facility] Problem list     Assessment & Plan:  Acute respiratory failure Intubated for airway protection in the setting of stroke ABG, chest x-ray reviewed Continue full vent support for now. No plans on weaning due to poor mental status  Cerebellar hemorrhage Continue Keppra, mannitol Hypertonic saline Cardene for goals SBP < 140  Best practice (evaluated daily)  Diet: NPO Pain/Anxiety/Delirium protocol (if indicated): Not on sedation VAP protocol (if indicated): Ordered DVT prophylaxis: SCDs GI prophylaxis: PPI Glucose control: Monitor Mobility: Bed Disposition: ICU  Goals of Care:  Last date of multidisciplinary goals of care discussion: Pending Family and staff present: No family at bedside Summary of discussion: Per primary team Follow up goals of care discussion due: 03/11/20 Code Status: Full  Labs   CBC: Recent Labs  Lab 03/29/2020 1016 03/24/2020 1029 03/25/2020 1111  WBC 15.6*  --   --   NEUTROABS 5.0  --   --   HGB 15.7 16.7 10.2*  HCT 48.4 49.0 30.0*  MCV 79.6*  --   --   PLT 316  --   --     Basic Metabolic Panel: Recent Labs  Lab 03/14/2020 1016 03/25/2020 1029 03/09/2020 1040 03/09/2020 1111 03/14/2020 1800  NA 139 139  --  146* 147*  K 2.7* 2.8*  --  2.5*  --   CL 104 107  --   --   --   CO2 22  --   --   --   --   GLUCOSE 253* 259*  --   --   --   BUN 14 19  --   --   --   CREATININE 1.12 0.90  --   --   --   CALCIUM 9.6  --   --   --   --   MG  --   --  1.7  --    --   PHOS  --   --   --   --  2.6   GFR: Estimated Creatinine Clearance: 93.2 mL/min (by C-G formula based on SCr of 0.9 mg/dL). Recent Labs  Lab 03/24/2020 1016  WBC 15.6*    Liver Function Tests: Recent Labs  Lab 03/14/2020 1016  AST 26  ALT 25  ALKPHOS 65  BILITOT 0.8  PROT 7.8  ALBUMIN 4.1   No results for input(s): LIPASE, AMYLASE in the last 168 hours. No results for input(s): AMMONIA in the last 168 hours.  ABG    Component Value Date/Time   PHART 7.291 (L) 03/11/2020 1111   PCO2ART 40.9 03/12/2020 1111   PO2ART 283 (H) 03/13/2020 1111   HCO3 19.7 (L) 03/03/2020 1111   TCO2 21 (L) 03/03/2020 1111   ACIDBASEDEF 6.0 (H) 03/29/2020 1111   O2SAT 100.0 03/16/2020 1111     Coagulation Profile: Recent Labs  Lab 03/11/2020 1016  INR 1.0    Cardiac Enzymes: No results for input(s): CKTOTAL, CKMB, CKMBINDEX, TROPONINI in the last 168 hours.  HbA1C: HbA1c, POC (controlled diabetic range)  Date/Time Value Ref Range Status  08/11/2019 10:42 AM 6.8 0.0 - 7.0 % Final  03/10/2019 10:20 AM 6.5 0.0 - 7.0 % Final   Hgb A1c MFr Bld  Date/Time Value Ref Range Status  03/16/2020 10:41 AM 7.5 (H) 4.8 - 5.6 % Final    Comment:    (NOTE) Pre diabetes:          5.7%-6.4%  Diabetes:              >6.4%  Glycemic control for   <7.0% adults with diabetes     CBG: Recent Labs  Lab 03/03/2020 1020  GLUCAP 248*    Review of Systems:   Unable to obtain due to altered mental status  Past Medical History:  He,  has a past medical history of Ascending aorta dilatation ([place]) (02/25/2015), Essential hypertension (02/25/2015), History of TIA (transient ischemic attack) (03/05/2015), Sleep apnea, Stroke ([place]), and Type 2 diabetes mellitus ([place]) (03/06/2015).   Surgical History:  History reviewed. No pertinent surgical history.   Social History:   reports that he has never smoked. He has never used smokeless tobacco. He reports that he does not drink alcohol and does not use drugs.    Family History:  His family history is negative for Colon cancer, Esophageal cancer, Stomach cancer, and Rectal cancer.   Allergies No Known Allergies   Home Medications  Prior to Admission medications   Medication Sig Start Date End Date Taking? Authorizing Provider  ADVIL 200  MG tablet Take 400-800 mg by mouth every 6 (six) hours as needed (for headaches).   Yes [provider]  aspirin 325 MG tablet Take 1 tablet (325 mg total) by mouth daily. 06/21/15  Yes Charlott Rakes, MD  atorvastatin (LIPITOR) 20 MG tablet Take 1 tablet (20 mg total) by mouth daily at 6 PM. 08/11/19  Yes Newlin, Enobong, MD  carvedilol (COREG) 6.25 MG tablet Take 1 tablet (6.25 mg total) by mouth 2 (two) times daily with a meal. 08/11/19  Yes Newlin, Enobong, MD  losartan-hydrochlorothiazide (HYZAAR) 100-25 MG tablet Take 1 tablet by mouth daily. 08/11/19  Yes Charlott Rakes, MD  metFORMIN (GLUCOPHAGE) 500 MG tablet Take 1 tablet (500 mg total) by mouth daily with breakfast. 08/11/19  Yes Newlin, Enobong, MD  omeprazole (PRILOSEC) 20 MG capsule Take 1 capsule (20 mg total) by mouth daily. 08/11/19  Yes Charlott Rakes, MD  Blood Glucose Monitoring Suppl (ONE TOUCH ULTRA 2) w/Device KIT Use as directed 10/05/15   Charlott Rakes, MD  glucose blood (ONE TOUCH ULTRA TEST) test strip Use as instructed 09/27/15   Charlott Rakes, MD  Lancets (ONETOUCH ULTRASOFT) lancets Use as instructed 10/05/15   Charlott Rakes, MD  methocarbamol (ROBAXIN) 500 MG tablet Take 2 tablets (1,000 mg total) by mouth at bedtime as needed for muscle spasms. 03/10/19   Charlott Rakes, MD  naproxen (NAPROSYN) 500 MG tablet Take 1 tablet (500 mg total) by mouth 2 (two) times daily. 08/29/19   Recardo Evangelist, PA-C  Olopatadine HCl (PATADAY) 0.2 % SOLN PLACE 1 DROP INTO BOTH EYES 2(TWO) TIMES DAILY. Patient taking differently: Place 1 drop into both eyes 2 (two) times daily. 11/13/16   Charlott Rakes, MD     Critical care time:    The  patient is critically ill with multiple organ system failure and requires high complexity decision making for assessment and support, frequent evaluation and titration of therapies, advanced monitoring, review of radiographic studies and interpretation of complex data.   Critical Care Time devoted to patient care services, exclusive of separately billable procedures, described in this note is 45 minutes.   Marshell Garfinkel MD [facility] See Amion for pager  If no response to pager , please call [phone] until 7pm After 7:00 pm call Elink  [phone] 03/18/2020, 10:09 PM

## 2020-03-04 NOTE — ED Notes (Signed)
Pt is intubated at this time 

## 2020-03-04 NOTE — ED Notes (Signed)
Bed is not ready at this time. Report given to ED RN at this time.

## 2020-03-04 NOTE — ED Notes (Signed)
Report called and given to inpatient RN. Per inpatient RN room is dirty at this time.

## 2020-03-04 NOTE — ED Notes (Addendum)
[phone] Fred Brown- Is the patient interpreter. Please call when translating to patient.

## 2020-03-04 NOTE — Consult Note (Signed)
Chief Complaint   Chief Complaint  Patient presents with  . Code Stroke    HPI   Consult requested by: Neurology Reason for consult: ICH  HPI: Fred Brown is a 55 y.o. male with mutliple medical comorbidities listed below who presented to the ED after acute onset headache followed by nausea vomiting and ultimately LOC in transport per EMS. LKN 0930.Patient arrived with GCS3. Intubated on arrival - did not require sedation. Patient immediately went for CT head and was found to have a large cerebellar hemorrhage. A NSY consultation was requested. Patient has been started on levo due to hypotension.   Patient Active Problem List   Diagnosis Date Noted  . 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 Abdominal aortic aneurysm, ruptured ([place]) 05/07/2017  . GERD (gastroesophageal reflux disease) 05/30/2016  . Hyperlipidemia 03/22/2015  . Type 2 diabetes mellitus ([place]) 03/06/2015  . History of TIA (transient ischemic attack) 03/05/2015  . Numbness on right side 02/25/2015  . Essential hypertension 02/25/2015  . Chest pain 02/25/2015  . Patient nonadherence 02/25/2015  . History of CVA (remote and previously undiagnosed) 02/25/2015  . Hypokalemia 02/25/2015  . Cough 02/25/2015  . Ascending aorta dilatation ([facility]) 02/25/2015  . Acute right-sided weakness 02/25/2015    PMH: Past Medical History:  Diagnosis Date  . Ascending aorta dilatation ([facility]) 02/25/2015  . Essential hypertension 02/25/2015  . History of TIA (transient ischemic attack) 03/05/2015  . Sleep apnea    no cpap  . Stroke ([place])    tia  . Type 2 diabetes mellitus ([place]) 03/06/2015    PSH: History reviewed. No pertinent surgical history.  (Not in a hospital admission)   SH: Social History   Tobacco Use  . Smoking status: Never Smoker  . Smokeless tobacco: Never Used  Substance Use Topics  . Alcohol use: No  . Drug use: No    MEDS: Prior to Admission medications   Medication Sig Start Date End  Date Taking? Authorizing Provider  aspirin 325 MG tablet Take 1 tablet (325 mg total) by mouth daily. 06/21/15   Charlott Rakes, MD  atorvastatin (LIPITOR) 20 MG tablet Take 1 tablet (20 mg total) by mouth daily at 6 PM. 08/11/19   Charlott Rakes, MD  Blood Glucose Monitoring Suppl (ONE TOUCH ULTRA 2) w/Device KIT Use as directed 10/05/15   Charlott Rakes, MD  carvedilol (COREG) 6.25 MG tablet Take 1 tablet (6.25 mg total) by mouth 2 (two) times daily with a meal. 08/11/19   Charlott Rakes, MD  glucose blood (ONE TOUCH ULTRA TEST) test strip Use as instructed 09/27/15   Charlott Rakes, MD  Lancets (ONETOUCH ULTRASOFT) lancets Use as instructed 10/05/15   Charlott Rakes, MD  losartan-hydrochlorothiazide (HYZAAR) 100-25 MG tablet Take 1 tablet by mouth daily. 08/11/19   Charlott Rakes, MD  metFORMIN (GLUCOPHAGE) 500 MG tablet Take 1 tablet (500 mg total) by mouth daily with breakfast. 08/11/19   Charlott Rakes, MD  methocarbamol (ROBAXIN) 500 MG tablet Take 2 tablets (1,000 mg total) by mouth at bedtime as needed for muscle spasms. Patient not taking: Reported on 08/11/2019 03/10/19   Charlott Rakes, MD  naproxen (NAPROSYN) 500 MG tablet Take 1 tablet (500 mg total) by mouth 2 (two) times daily. 08/29/19   Recardo Evangelist, PA-C  Olopatadine HCl (PATADAY) 0.2 % SOLN PLACE 1 DROP INTO BOTH EYES 2(TWO) TIMES DAILY. 11/13/16   Charlott Rakes, MD  omeprazole (PRILOSEC) 20 MG capsule Take 1 capsule (20 mg total) by mouth  daily. 08/11/19   Charlott Rakes, MD  Polyethylene Glycol 3350 (MIRALAX PO) Take 238 g by mouth once. Colonoscopy bowel prep    [provider]    ALLERGY: No Known Allergies  Social History   Tobacco Use  . Smoking status: Never Smoker  . Smokeless tobacco: Never Used  Substance Use Topics  . Alcohol use: No     Family History  Problem Relation Age of Onset  . Colon cancer Neg Hx   . Esophageal cancer Neg Hx   . Stomach cancer Neg Hx   . Rectal cancer Neg Hx       ROS   ROS  intubated  Exam   Vitals:   03/03/2020 1223 03/10/2020 1225  BP: (!) 143/97 (!) 165/113  Pulse: 80 73  Resp: 17 20  Temp:    SpO2: 98% 98%   Intubated No sedation running Pupils 33m, non-reactive bilaterally No response to central pain + corneal L, no cough/gag  Results - Imaging/Labs   Results for orders placed or performed during the hospital encounter of 03/05/2020 (from the past 48 hour(s))  Protime-INR     Status: None   Collection Time: 03/16/2020 10:16 AM  Result Value Ref Range   Prothrombin Time 12.3 11.4 - 15.2 seconds   INR 1.0 0.8 - 1.2    Comment: (NOTE) INR goal varies based on device and disease states. Performed at [facility] [address], [place] [address]  APTT     Status: None   Collection Time: 03/16/2020 10:16 AM  Result Value Ref Range   aPTT 24 24 - 36 seconds    Comment: Performed at [address] [place] [address] CBC     Status: Abnormal   Collection Time: 03/19/2020 10:16 AM  Result Value Ref Range   WBC 15.6 (H) 4.0 - 10.5 K/uL   RBC 6.08 (H) 4.22 - 5.81 MIL/uL   Hemoglobin 15.7 13.0 - 17.0 g/dL   HCT 48.4 39.0 - 52.0 %   MCV 79.6 (L) 80.0 - 100.0 fL   MCH 25.8 (L) 26.0 - 34.0 pg   MCHC 32.4 30.0 - 36.0 g/dL   RDW 12.8 11.5 - 15.5 %   Platelets 316 150 - 400 K/uL   nRBC 0.0 0.0 - 0.2 %    Comment: Performed at [facility] [address] [place] [id number] Differential     Status: Abnormal   Collection Time: 03/11/2020 10:16 AM  Result Value Ref Range   Neutrophils Relative % 32 %   Neutro Abs 5.0 1.7 - 7.7 K/uL   Lymphocytes Relative 61 %   Lymphs Abs 9.5 (H) 0.7 - 4.0 K/uL   Monocytes Relative 4 %   Monocytes Absolute 0.6 0.1 - 1.0 K/uL   Eosinophils Relative 2 %   Eosinophils Absolute 0.3 0.0 - 0.5 K/uL   Basophils Relative 1 %   Basophils Absolute 0.2 (H) 0.0 - 0.1 K/uL   nRBC 0 0 /100 WBC   Abs Immature Granulocytes 0.00 0.00 - 0.07 K/uL    Polychromasia PRESENT     Comment: Performed at [address] [place] [id number] Comprehensive metabolic panel     Status: Abnormal   Collection Time: 03/23/2020 10:16 AM  Result Value Ref Range   Sodium 139 135 - 145 mmol/L   Potassium 2.7 (LL) 3.5 - 5.1 mmol/L  Comment: CRITICAL RESULT CALLED TO, READ BACK BY AND VERIFIED WITH: J.VAZQUEZ,RN 1208 03/15/2020 CLARK,S    Chloride 104 98 - 111 mmol/L   CO2 22 22 - 32 mmol/L   Glucose, Bld 253 (H) 70 - 99 mg/dL    Comment: Glucose reference range applies only to samples taken after fasting for at least 8 hours.   BUN 14 6 - 20 mg/dL   Creatinine, Ser 1.12 0.61 - 1.24 mg/dL   Calcium 9.6 8.9 - 10.3 mg/dL   Total Protein 7.8 6.5 - 8.1 g/dL   Albumin 4.1 3.5 - 5.0 g/dL   AST 26 15 - 41 U/L   ALT 25 0 - 44 U/L   Alkaline Phosphatase 65 38 - 126 U/L   Total Bilirubin 0.8 0.3 - 1.2 mg/dL   GFR, Estimated >60 >60 mL/min    Comment: (NOTE) Calculated using the CKD-EPI Creatinine Equation (2021)    Anion gap 13 5 - 15    Comment: Performed at [facility] [address], [place], [place] [address]  CBG monitoring, ED     Status: Abnormal   Collection Time: 03/11/2020 10:20 AM  Result Value Ref Range   Glucose-Capillary 248 (H) 70 - 99 mg/dL    Comment: Glucose reference range applies only to samples taken after fasting for at least 8 hours.   Comment 1 Notify RN    Comment 2 Document in Chart   I-stat chem 8, ED     Status: Abnormal   Collection Time: 03/20/2020 10:29 AM  Result Value Ref Range   Sodium 139 135 - 145 mmol/L   Potassium 2.8 (L) 3.5 - 5.1 mmol/L   Chloride 107 98 - 111 mmol/L   BUN 19 6 - 20 mg/dL   Creatinine, Ser 0.90 0.61 - 1.24 mg/dL   Glucose, Bld 259 (H) 70 - 99 mg/dL    Comment: Glucose reference range applies only to samples taken after fasting for at least 8 hours.   Calcium, Ion 1.10 (L) 1.15 - 1.40 mmol/L   TCO2 21 (L) 22 - 32 mmol/L   Hemoglobin 16.7 13.0 - 17.0 g/dL   HCT  49.0 39.0 - 52.0 %  I-Stat arterial blood gas, ED     Status: Abnormal   Collection Time: 03/27/2020 11:11 AM  Result Value Ref Range   pH, Arterial 7.291 (L) 7.350 - 7.450   pCO2 arterial 40.9 32.0 - 48.0 mmHg   pO2, Arterial 283 (H) 83.0 - 108.0 mmHg   Bicarbonate 19.7 (L) 20.0 - 28.0 mmol/L   TCO2 21 (L) 22 - 32 mmol/L   O2 Saturation 100.0 %   Acid-base deficit 6.0 (H) 0.0 - 2.0 mmol/L   Sodium 146 (H) 135 - 145 mmol/L   Potassium 2.5 (LL) 3.5 - 5.1 mmol/L   Calcium, Ion 1.17 1.15 - 1.40 mmol/L   HCT 30.0 (L) 39.0 - 52.0 %   Hemoglobin 10.2 (L) 13.0 - 17.0 g/dL   Sample type ARTERIAL    Comment NOTIFIED PHYSICIAN     DG Chest Port 1 View  Result Date: 03/10/2020 CLINICAL DATA:  Stroke status post intubation. EXAM: PORTABLE CHEST 1 VIEW COMPARISON:  Chest radiograph August 29, 2019 FINDINGS: Endotracheal tube with tip overlying the midthoracic trachea. Nasogastric tube coursing below the diaphragm with tip overlying stomach. Left-sided defibrillator pads. EKG leads and cardiac marked ring device overlie the chest. The heart size and mediastinal contours are partially obscured. The visualized skeletal structures are unremarkable. IMPRESSION: Endotracheal  tube with tip overlying the midthoracic trachea. Nasogastric tube coursing below the diaphragm with tip overlying stomach and side port likely at the GE junction recommend 3 cm advanced. Electronically Signed   By: Dahlia Bailiff MD   On: 03/18/2020 10:42   CT HEAD CODE STROKE WO CONTRAST  Result Date: 03/17/2020 CLINICAL DATA:  Code stroke.  Acute neuro deficit. EXAM: CT HEAD WITHOUT CONTRAST TECHNIQUE: Contiguous axial images were obtained from the base of the skull through the vertex without intravenous contrast. COMPARISON:  02/25/2015 FINDINGS: Brain: Large dissecting hematoma in the left more than right cerebellum with fourth ventricular extension extending superiorly into the bilateral lateral ventricles. The hematoma and adjacent edema  compresses the cerebellum and brainstem with obstructive hydrocephalus. Chronic small vessel ischemic changes in the cerebral white matter. Chronic lacunar infarcts with seen on a comparison imaging. There is a small high-density focus in the left frontal white matter measuring 5 mm, chronic when compared to 2017 gradient image. Vascular: No acute finding Skull: Negative Sinuses/Orbits: Negative Other: Critical Value/emergent results were called by telephone at the time of interpretation on 03/20/2020 at 10:46 am to provider Theda Sers, who is already aware. ASPECTS [facility] Stroke Program Early CT Score) Not scored in this setting IMPRESSION: 1. Large cerebellar hematoma with diffuse intraventricular extension and cerebellar edema. There is marked mass effect on the cerebellum and brainstem with obstructive hydrocephalus. 2. Underlying chronic small vessel disease with remote small vessel infarcts. Electronically Signed   By: Monte Fantasia M.D.   On: 03/26/2020 10:47   Impression/Plan   55 y.o. male with large cerebellar hemorrhage with intraventricular extension and marked edema and mass effect. He has minimal evidence of brainstem reflexes. Imaging reviewed with Dr Kathyrn Sheriff who has also reviewed the case with Dr Theda Sers, Neurology. There is no role for NS intervention. This is a non-survivable injury.I have updated the wife and her friend at her request. Rec transition to comfort care.   Ferne Reus, PA-C [facility] [employer]

## 2020-03-04 NOTE — ED Provider Notes (Signed)
[facility] EMERGENCY DEPARTMENT Provider Note   CSN: [id number] Arrival date & time: 03/28/2020  1014     History Chief Complaint  Patient presents with  . Code Stroke    Fred Brown is a 55 y.o. male.  55 yo M with a chief complaints of sudden onset headache.  Per EMS the patient complained of a sudden onset headache and then collapsed.  Became unresponsive in route requiring oral airway.  They were unable to intubate and bagged him to the ED.  Unresponsive on arrival.  Level 5 caveat acuity of condition.        Past Medical History:  Diagnosis Date  . Ascending aorta dilatation (HCC) 02/25/2015  . Essential hypertension 02/25/2015  . History of TIA (transient ischemic attack) 03/05/2015  . Sleep apnea    no cpap  . Stroke ([place])    tia  . Type 2 diabetes mellitus ([place]) 03/06/2015    Patient Active Problem List   Diagnosis Date Noted  . Stroke due to intracerebral hemorrhage ([place]) 03/05/2020  . Abdominal aortic aneurysm, ruptured ([place]) 05/07/2017  . GERD (gastroesophageal reflux disease) 05/30/2016  . Hyperlipidemia 03/22/2015  . Type 2 diabetes mellitus ([place]) 03/06/2015  . History of TIA (transient ischemic attack) 03/05/2015  . Numbness on right side 02/25/2015  . Essential hypertension 02/25/2015  . Chest pain 02/25/2015  . Patient nonadherence 02/25/2015  . History of CVA (remote and previously undiagnosed) 02/25/2015  . Hypokalemia 02/25/2015  . Cough 02/25/2015  . Ascending aorta dilatation (HCC) 02/25/2015  . Acute right-sided weakness 02/25/2015    History reviewed. No pertinent surgical history.     Family History  Problem Relation Age of Onset  . Colon cancer Neg Hx   . Esophageal cancer Neg Hx   . Stomach cancer Neg Hx   . Rectal cancer Neg Hx     Social History   Tobacco Use  . Smoking status: Never Smoker  . Smokeless tobacco: Never Used  Substance Use Topics  . Alcohol use: No  . Drug use: No    Home Medications Prior  to Admission medications   Medication Sig Start Date End Date Taking? Authorizing Provider  aspirin 325 MG tablet Take 1 tablet (325 mg total) by mouth daily. 06/21/15   Charlott Rakes, MD  atorvastatin (LIPITOR) 20 MG tablet Take 1 tablet (20 mg total) by mouth daily at 6 PM. 08/11/19   Charlott Rakes, MD  Blood Glucose Monitoring Suppl (ONE TOUCH ULTRA 2) w/Device KIT Use as directed 10/05/15   Charlott Rakes, MD  carvedilol (COREG) 6.25 MG tablet Take 1 tablet (6.25 mg total) by mouth 2 (two) times daily with a meal. 08/11/19   Charlott Rakes, MD  glucose blood (ONE TOUCH ULTRA TEST) test strip Use as instructed 09/27/15   Charlott Rakes, MD  Lancets (ONETOUCH ULTRASOFT) lancets Use as instructed 10/05/15   Charlott Rakes, MD  losartan-hydrochlorothiazide (HYZAAR) 100-25 MG tablet Take 1 tablet by mouth daily. 08/11/19   Charlott Rakes, MD  metFORMIN (GLUCOPHAGE) 500 MG tablet Take 1 tablet (500 mg total) by mouth daily with breakfast. 08/11/19   Charlott Rakes, MD  methocarbamol (ROBAXIN) 500 MG tablet Take 2 tablets (1,000 mg total) by mouth at bedtime as needed for muscle spasms. Patient not taking: Reported on 08/11/2019 03/10/19   Charlott Rakes, MD  naproxen (NAPROSYN) 500 MG tablet Take 1 tablet (500 mg total) by mouth 2 (two) times daily. 08/29/19   Recardo Evangelist, PA-C  Olopatadine HCl (  PATADAY) 0.2 % SOLN PLACE 1 DROP INTO BOTH EYES 2(TWO) TIMES DAILY. 11/13/16   Charlott Rakes, MD  omeprazole (PRILOSEC) 20 MG capsule Take 1 capsule (20 mg total) by mouth daily. 08/11/19   Charlott Rakes, MD  Polyethylene Glycol 3350 (MIRALAX PO) Take 238 g by mouth once. Colonoscopy bowel prep    [provider]    Allergies    Patient has no known allergies.  Review of Systems   Review of Systems  Unable to perform ROS: Patient unresponsive    Physical Exam Updated Vital Signs BP (!) 110/95   Pulse 61   Temp 98 F (36.7 C) (Rectal)   Resp 17   Ht _0  (1.676 m)   Wt 77.8 kg    SpO2 100%   BMI 27.68 kg/m   Physical Exam Vitals and nursing note reviewed.  Constitutional:      Appearance: He is well-developed and well-nourished.  HENT:     Head: Normocephalic and atraumatic.  Eyes:     Extraocular Movements: EOM normal.     Pupils: Pupils are equal, round, and reactive to light.     Comments: 80m minimally reactive  Neck:     Vascular: No JVD.  Cardiovascular:     Rate and Rhythm: Normal rate and regular rhythm.     Heart sounds: No murmur heard. No friction rub. No gallop.   Pulmonary:     Comments: Apnea.  Coarse breath sounds bilaterally Abdominal:     General: There is no distension.     Tenderness: There is no abdominal tenderness. There is no guarding or rebound.  Musculoskeletal:        General: Normal range of motion.     Cervical back: Normal range of motion and neck supple.  Skin:    Coloration: Skin is not pale.     Findings: No rash.  Neurological:     Comments: Unresponsive.  No pain response.  Psychiatric:        Mood and Affect: Mood and affect normal.     ED Results / Procedures / Treatments   Labs (all labs ordered are listed, but only abnormal results are displayed) Labs Reviewed  CBC - Abnormal; Notable for the following components:      Result Value   WBC 15.6 (*)    RBC 6.08 (*)    MCV 79.6 (*)    MCH 25.8 (*)    All other components within normal limits  I-STAT CHEM 8, ED - Abnormal; Notable for the following components:   Potassium 2.8 (*)    Glucose, Bld 259 (*)    Calcium, Ion 1.10 (*)    TCO2 21 (*)    All other components within normal limits  CBG MONITORING, ED - Abnormal; Notable for the following components:   Glucose-Capillary 248 (*)    All other components within normal limits  PROTIME-INR  APTT  DIFFERENTIAL  COMPREHENSIVE METABOLIC PANEL  SODIUM  SODIUM  HIV ANTIBODY (ROUTINE TESTING W REFLEX)  COMPREHENSIVE METABOLIC PANEL  PROTIME-INR  LIPID PANEL  BLOOD GAS, ARTERIAL  HEMOGLOBIN A1C   RAPID URINE DRUG SCREEN, HOSP PERFORMED  URINALYSIS, ROUTINE W REFLEX MICROSCOPIC  APTT  ETHANOL  OSMOLALITY  MAGNESIUM  PHOSPHORUS  TYPE AND SCREEN  TROPONIN I (HIGH SENSITIVITY)    EKG EKG Interpretation  Date/Time:  Thursday March 04 2020 10:25:18 EST Ventricular Rate:  86 PR Interval:    QRS Duration: 91 QT Interval:  319 QTC Calculation: 382 R  Axis:   34 Text Interpretation: Sinus rhythm Repol abnrm, severe global ischemia (LM/MVD) Otherwise no significant change Confirmed by Deno Etienne [phone]) on 03/28/2020 10:29:33 AM   Radiology DG Chest Port 1 View  Result Date: 03/14/2020 CLINICAL DATA:  Stroke status post intubation. EXAM: PORTABLE CHEST 1 VIEW COMPARISON:  Chest radiograph August 29, 2019 FINDINGS: Endotracheal tube with tip overlying the midthoracic trachea. Nasogastric tube coursing below the diaphragm with tip overlying stomach. Left-sided defibrillator pads. EKG leads and cardiac marked ring device overlie the chest. The heart size and mediastinal contours are partially obscured. The visualized skeletal structures are unremarkable. IMPRESSION: Endotracheal tube with tip overlying the midthoracic trachea. Nasogastric tube coursing below the diaphragm with tip overlying stomach and side port likely at the GE junction recommend 3 cm advanced. Electronically Signed   By: Dahlia Bailiff MD   On: 03/10/2020 10:42   CT HEAD CODE STROKE WO CONTRAST  Result Date: 03/20/2020 CLINICAL DATA:  Code stroke.  Acute neuro deficit. EXAM: CT HEAD WITHOUT CONTRAST TECHNIQUE: Contiguous axial images were obtained from the base of the skull through the vertex without intravenous contrast. COMPARISON:  02/25/2015 FINDINGS: Brain: Large dissecting hematoma in the left more than right cerebellum with fourth ventricular extension extending superiorly into the bilateral lateral ventricles. The hematoma and adjacent edema compresses the cerebellum and brainstem with obstructive hydrocephalus.  Chronic small vessel ischemic changes in the cerebral white matter. Chronic lacunar infarcts with seen on a comparison imaging. There is a small high-density focus in the left frontal white matter measuring 5 mm, chronic when compared to 2017 gradient image. Vascular: No acute finding Skull: Negative Sinuses/Orbits: Negative Other: Critical Value/emergent results were called by telephone at the time of interpretation on 03/29/2020 at 10:46 am to provider Theda Sers, who is already aware. ASPECTS [facility] Stroke Program Early CT Score) Not scored in this setting IMPRESSION: 1. Large cerebellar hematoma with diffuse intraventricular extension and cerebellar edema. There is marked mass effect on the cerebellum and brainstem with obstructive hydrocephalus. 2. Underlying chronic small vessel disease with remote small vessel infarcts. Electronically Signed   By: Monte Fantasia M.D.   On: 03/12/2020 10:47    Procedures Procedure Name: Intubation Date/Time: 03/12/2020 11:02 AM Performed by: Deno Etienne, DO Pre-anesthesia Checklist: Patient identified, Patient being monitored, Emergency Drugs available, Timeout performed and Suction available Oxygen Delivery Method: Non-rebreather mask Preoxygenation: Pre-oxygenation with 100% oxygen Induction Type: Rapid sequence Ventilation: Mask ventilation without difficulty Laryngoscope Size: Glidescope Grade View: Grade I Tube size: 7.5 mm Number of attempts: 1 Airway Equipment and Method: Video-laryngoscopy Placement Confirmation: ETT inserted through vocal cords under direct vision,  CO2 detector and Breath sounds checked- equal and bilateral Secured at: 23 (teeth) cm Dental Injury: Teeth and Oropharynx as per pre-operative assessment  Difficulty Due To: Difficulty was anticipated, Difficult Airway- due to large tongue and Difficult Airway- due to limited oral opening Future Recommendations: Recommend- induction with short-acting agent, and alternative techniques  readily available     .Critical Care Performed by: Deno Etienne, DO Authorized by: Deno Etienne, DO   Critical care provider statement:    Critical care time (minutes):  80   Critical care time was exclusive of:  Separately billable procedures and treating other patients   Critical care was necessary to treat or prevent imminent or life-threatening deterioration of the following conditions:  CNS failure or compromise and circulatory failure   Critical care was time spent personally by me on the following activities:  Development of treatment plan  with patient or surrogate, discussions with consultants, evaluation of patient's response to treatment, examination of patient, obtaining history from patient or surrogate, ventilator management, review of old charts, re-evaluation of patient's condition, pulse oximetry, ordering and review of radiographic studies, ordering and review of laboratory studies and ordering and performing treatments and interventions   Care discussed with: admitting provider       Medications Ordered in ED Medications  sodium chloride flush (NS) 0.9 % injection 3 mL (0 mLs Intravenous Hold 03/29/2020 1045)  sodium chloride (hypertonic) 3 % solution (has no administration in time range)   stroke: mapping our early stages of recovery book (0 each Does not apply Hold 03/21/2020 1052)  acetaminophen (TYLENOL) tablet 650 mg (has no administration in time range)    Or  acetaminophen (TYLENOL) 160 MG/5ML solution 650 mg (has no administration in time range)    Or  acetaminophen (TYLENOL) suppository 650 mg (has no administration in time range)  senna-docusate (Senokot-S) tablet 1 tablet (0 tablets Per Tube Hold 03/27/2020 1100)  pantoprazole (PROTONIX) injection 40 mg (has no administration in time range)  levETIRAcetam (KEPPRA) IVPB 500 mg/100 mL premix (has no administration in time range)  sodium chloride 3% (hypertonic) IV bolus 500 mL (500 mLs Intravenous New Bag/Given 03/03/2020 1059)   0.9 %  sodium chloride infusion (0 mLs Intravenous Hold 03/11/2020 1100)  norepinephrine (LEVOPHED) 67m in 25110mpremix infusion (10 mcg/min Intravenous New Bag/Given 03/27/2020 1101)  fentaNYL (SUBLIMAZE) injection 100 mcg (100 mcg Intravenous Given 03/08/2020 1030)  levETIRAcetam (KEPPRA) IVPB 1000 mg/100 mL premix (0 mg Intravenous Stopped 03/15/2020 1106)  sodium chloride 0.9 % bolus 1,000 mL (1,000 mLs Intravenous New Bag/Given 03/18/2020 1059)    ED Course  I have reviewed the triage vital signs and the nursing notes.  Pertinent labs & imaging results that were available during my care of the patient were reviewed by me and considered in my medical decision making (see chart for details).    MDM Rules/Calculators/A&P                          [age] M with a chief complaints of sudden onset headache.  Patient completely unresponsive on arrival and intubated.  Had some cardiac ectopy.  History is concerning for a intracranial hemorrhage with herniation.  Taken emergently  to CT, unfortunately with a very large intracranial hemorrhage with extension into the ventricles with signs of herniation.  Upon return from CT now significantly hypotensive consistent with the same.  Given IV fluids started on Levophed while awaiting neurosurgery evaluation.  The patients results and plan were reviewed and discussed.   Any x-rays performed were independently reviewed by myself.   Differential diagnosis were considered with the presenting HPI.  Medications  sodium chloride flush (NS) 0.9 % injection 3 mL (0 mLs Intravenous Hold 03/19/2020 1045)  sodium chloride (hypertonic) 3 % solution (has no administration in time range)   stroke: mapping our early stages of recovery book (0 each Does not apply Hold 03/20/2020 1052)  acetaminophen (TYLENOL) tablet 650 mg (has no administration in time range)    Or  acetaminophen (TYLENOL) 160 MG/5ML solution 650 mg (has no administration in time range)    Or  acetaminophen (TYLENOL)  suppository 650 mg (has no administration in time range)  senna-docusate (Senokot-S) tablet 1 tablet (0 tablets Per Tube Hold 03/05/2020 1100)  pantoprazole (PROTONIX) injection 40 mg (has no administration in time range)  levETIRAcetam (KEPPRA) IVPB  500 mg/100 mL premix (has no administration in time range)  sodium chloride 3% (hypertonic) IV bolus 500 mL (500 mLs Intravenous New Bag/Given 03/25/2020 1059)  0.9 %  sodium chloride infusion (0 mLs Intravenous Hold 03/02/2020 1100)  norepinephrine (LEVOPHED) 10m in 25103mpremix infusion (10 mcg/min Intravenous New Bag/Given 03/21/2020 1101)  fentaNYL (SUBLIMAZE) injection 100 mcg (100 mcg Intravenous Given 03/21/2020 1030)  levETIRAcetam (KEPPRA) IVPB 1000 mg/100 mL premix (0 mg Intravenous Stopped 03/09/2020 1106)  sodium chloride 0.9 % bolus 1,000 mL (1,000 mLs Intravenous New Bag/Given 03/17/2020 1059)    Vitals:   03/10/2020 1045 03/29/2020 1100 03/17/2020 1103 03/10/2020 1106  BP: (!) 73/54 101/61 (!) 89/62 (!) 110/95  Pulse: 65 (!) 51 (!) 50 61  Resp: _0 Temp:  98 F (36.7 C)    TempSrc:  Rectal    SpO2: 99% 100% 100% 100%  Weight:      Height:        Final diagnoses:  Thunderclap headache  Stroke due to intracerebral hemorrhage (HCC)  Acute respiratory failure with hypoxia and hypercapnia ([facility]   Admission/ observation were discussed with the admitting physician, patient and/or family and they are comfortable with the plan.    Final Clinical Impression(s) / ED Diagnoses Final diagnoses:  Thunderclap headache  Stroke due to intracerebral hemorrhage (HCC)  [facility] with [facility] and [facility]capnia [facility]   Rx / DC Orders ED Discharge Orders    None       FlDeno EtienneDO 03/03/2020 1107

## 2020-03-04 NOTE — Consult Note (Addendum)
Neurology Consultation  CC: Thunderclap headache, slurred speech, altered mental status with rapid decline in neurologic status en route to ED progressing to unresponsiveness  History is obtained from: chart review, EMS, EDP  HPI: Fred Brown is a 55 y.o. male with a medical history significant for essential hypertension, TIA, type 2 diabetes mellitus, and OSA not on CPAP presented to the ED via EMS as a stroke alert for reported 09:30 acute onset of a thunderclap headache, slurred speech, and altered mental status with associated nausea and vomiting. En route to the ED, his mental status rapidly declined and he required assistance with ventilation. On arrival, he was immediately intubated- did not require sedation- and had no motor response to stimulus or pupillary responses. Initial blood pressure on arrival was 224/140.   LKW: 09:30 tpa given?: No, ICH IR Thrombectomy? No, ICH on exam Modified Rankin Scale: 0-Completely asymptomatic and back to baseline post- stroke NIHSS:  1a Level of Conscious.: 3 1b LOC Questions: 2 1c LOC Commands: 2 2 Best Gaze: 0 3 Visual: 3 4 Facial Palsy: 3 5a Motor Arm - left: 4 5b Motor Arm - Right: 4 6a Motor Leg - Left: 4 6b Motor Leg - Right: 4 7 Limb Ataxia: 0 8 Sensory: 2 9 Best Language: 3 10 Dysarthria: 2 11 Extinct. and Inatten.: 2 TOTAL: 38  ICH Score: 4 points GCS 3: 2 points, ICH volume: 1 point, IVH: 1 point   ROS:  Unable to obtain due to altered mental status.   Past Medical History:  Diagnosis Date  . Ascending aorta dilatation (HCC) 02/25/2015  . Essential hypertension 02/25/2015  . History of TIA (transient ischemic attack) 03/05/2015  . Sleep apnea    no cpap  . Stroke ([place])    tia  . Type 2 diabetes mellitus ([place]) 03/06/2015   Family History  Problem Relation Age of Onset  . Colon cancer Neg Hx   . Esophageal cancer Neg Hx   . Stomach cancer Neg Hx   . Rectal cancer Neg Hx    Social History:  reports that he has never  smoked. He has never used smokeless tobacco. He reports that he does not drink alcohol and does not use drugs.  Current Scheduled Medications: Current Outpatient Medications  Medication Instructions  . aspirin 325 mg, Oral, Daily  . atorvastatin (LIPITOR) 20 mg, Oral, Daily-1800  . Blood Glucose Monitoring Suppl (ONE TOUCH ULTRA 2) w/Device KIT Use as directed  . carvedilol (COREG) 6.25 mg, Oral, 2 times daily with meals  . glucose blood (ONE TOUCH ULTRA TEST) test strip Use as instructed  . Lancets (ONETOUCH ULTRASOFT) lancets Use as instructed  . losartan-hydrochlorothiazide (HYZAAR) 100-25 MG tablet 1 tablet, Oral, Daily  . metFORMIN (GLUCOPHAGE) 500 mg, Oral, Daily with breakfast  . methocarbamol (ROBAXIN) 1,000 mg, Oral, At bedtime PRN  . naproxen (NAPROSYN) 500 mg, Oral, 2 times daily  . Olopatadine HCl (PATADAY) 0.2 % SOLN PLACE 1 DROP INTO BOTH EYES 2(TWO) TIMES DAILY.  Marland Kitchen omeprazole (PRILOSEC) 20 mg, Oral, Daily  . Polyethylene Glycol 3350 (MIRALAX PO) 238 g, Oral,  Once, Colonoscopy bowel prep    Exam: Current vital signs: BP (!) 165/113   Pulse 73   Temp 98 F (36.7 C) (Rectal)   Resp 20   Ht '5\' 6"'  (1.676 m)   Wt 77.8 kg   SpO2 98%   BMI 27.68 kg/m   Physical Exam  Constitutional: appears in a coma, critically ill on initial appearance with artificial airway  in place with respirations assisted by EMS and fire.   Psych: Unable to assess, patient in a coma Eyes: Normal conjunctiva HENT: Artificial airway in place with assisted respirations.  Head normocephalic and atraumatic. Cardiovascular: Normal rate with irregular rhythm on arrival.  Respiratory: Respirations assisted via artificial airway and Ambu bag. Patient apneic without respiratory assistance.   GI: Rounded.  No distension. Skin: WDI  Neuro: Mental Status: Patient is comatose, unable to provide verbal or motor response. Unable to give clear or coherent history. No response noted to verbal or noxious  stimuli.  Cranial Nerves: II: Pupils are 12m and nonreactive bilaterally.  III,IV, VI: Unable to assess EOM, patient does not track examiner. No blink to threat present.  V: Unable to assess due to patient in comatose state.  VII: No facial movement present.   VIII: Unable to assess hearing. X, XI, XII: Head appears midline, no motor movement of shoulders or tongue.  XI: Shoulder shrug is symmetric. XII: tongue is midline without atrophy or fasciculations.  Motor: Tone is flaccid, no motor movement of any extremity to noxious stimuli.  Sensory: Unable to assess, no movement to noxious stimuli Plantars: [place] Cerebellar: Unable to assess   I have reviewed labs in epic and the pertinent results are: CMP     Component Value Date/Time   NA 146 (H) 03/24/2020 1111   NA 139 08/11/2019 1127   K 2.5 (LL) 03/23/2020 1111   CL 107 03/19/2020 1029   CO2 22 03/21/2020 1016   GLUCOSE 259 (H) 03/17/2020 1029   BUN 19 03/25/2020 1029   BUN 14 08/11/2019 1127   CREATININE 0.90 03/11/2020 1029   CREATININE 0.91 09/27/2015 0950   CALCIUM 9.6 03/19/2020 1016   PROT 7.8 03/11/2020 1016   PROT 7.7 08/11/2019 1127   ALBUMIN 4.1 03/15/2020 1016   ALBUMIN 4.7 08/11/2019 1127   AST 26 03/07/2020 1016   ALT 25 03/19/2020 1016   ALKPHOS 65 03/11/2020 1016   BILITOT 0.8 03/09/2020 1016   BILITOT 0.5 08/11/2019 1127   GFRNONAA >60 03/18/2020 1016   GFRNONAA >89 09/27/2015 0950   GFRAA 106 08/11/2019 1127   GFRAA >89 09/27/2015 0950  CBC    Component Value Date/Time   WBC 15.6 (H) 03/13/2020 1016   RBC 6.08 (H) 03/13/2020 1016   HGB 10.2 (L) 03/11/2020 1111   HCT 30.0 (L) 03/17/2020 1111   PLT 316 03/25/2020 1016   MCV 79.6 (L) 03/08/2020 1016   MCH 25.8 (L) 03/20/2020 1016   MCHC 32.4 03/26/2020 1016   RDW 12.8 03/19/2020 1016   LYMPHSABS 9.5 (H) 03/03/2020 1016   MONOABS 0.6 03/26/2020 1016   EOSABS 0.3 03/20/2020 1016   BASOSABS 0.2 (H) 03/15/2020 1016   Lipid Panel      Component Value Date/Time   CHOL 194 03/10/2019 1115   TRIG 132 03/10/2019 1115   HDL 40 03/10/2019 1115   CHOLHDL 4.9 03/10/2019 1115   CHOLHDL 3.3 06/24/2015 0850   VLDL 19 06/24/2015 0850   LDLCALC 130 (H) 03/10/2019 1115   LABVLDL 24 03/10/2019 1115   Lab Results  Component Value Date   HGBA1C 6.8 08/11/2019   Lab Results  Component Value Date   INR 1.0 03/18/2020    I have reviewed the images obtained: CT head code stroke IMPRESSION: 1. Large cerebellar hematoma with diffuse intraventricular extension and cerebellar edema. There is marked mass effect on the cerebellum and brainstem with obstructive hydrocephalus. 2. Underlying chronic small vessel disease with remote  small vessel infarcts.  Impression: 55 year old male with history as above [facility] as a code stroke for sudden onset at 09:30 of a thunderclap headache, slurred speech, and altered mental status with rapid decline in neurologic status en route. On arrival, he was not responsive to noxious stimuli and his pupils were fixed bilaterally and he required assistance with ventilation due to apnea. He was intubated and transported to CT head that revealed a large cerebellar hematoma with diffuse IVH extension and cerebellar edema with mass effect on the cerebellum and brainstem with associated obstructive hydrocephalus. Etiology of hemorrhage is likely hypertensive vs aneurysmal cause of ICH. The brainstem compression is not likely survivable.  Recommendations: 1) Recommendations:  - Admit to ICU - Hypertonic 3% 500 mL bolus and infusion at 12m/h for cerebral edema. Mannitol 20%, 70g infusion - Stability scan in 6 hours or STAT with any neurological decline - Frequent neuro checks; q162m for 1 hour, then q1hour - No antiplatelets or anticoagulants due to ICHayden SCD for DVT prophylaxis, add heparin at 24 hours if stable - Blood pressure control with goal systolic <1<616cleverplex and labetalol PRN - Labs:  HgbA1c, fasting lipid panel, type and screen, CMP, CBC, UDS, UA, magnesium, phosphorous, INR/PT - MRI brain with and without contrast at 24 hours when stabilized to evaluate for underlying mass - CT angio of the brain STAT once stabilized to evaluate for underlying vascular lesion - Risk factor modification - Echocardiogram - Telemetry monitoring; 30 day event monitor on discharge if no arrythmias captured  - PT consult, OT consult, Speech consult when patient stabilized  - Stroke team to follow   Addendum: Neurosurgery evaluated the patient and felt the injury was not survivable and there was no intervention they could offer. Discussed the poor prognosis with the patient's wife and through an interpreter and his wife confirmed he is full code and she would like aggressive medical management to continue.  This patient is critically ill and at significant risk of neurological worsening, death and care requires constant monitoring of vital signs, hemodynamics,respiratory and cardiac monitoring, neurological assessment, discussion with family, other specialists and medical decision making of high complexity. I spent 73 minutes of neurocritical care time in the care of  this patient. This was time spent independent of any time provided by nurse practitioner or PA.    HuLynnae SandhoffMD Page: [phone]

## 2020-03-04 NOTE — Progress Notes (Signed)
   03-15-2020 2135  Clinical Encounter Type  Visited With Patient and family together  Visit Type Critical Care;Social support  Referral From Nurse  Consult/Referral To Chaplain  Spiritual Encounters  Spiritual Needs Prayer  Chaplain responded to the call from Nurse Mitch.  Mrs. Musich requested prayer.  She has strong hope and faith, this is a difficult time for her and her husband.  She was once again grateful for my presence and prayer.  I explained I was on call tonight and if she needs me to return have Nurse Mitch call the Chaplain.  Chaplain Kenedie Dirocco [place] [phone]

## 2020-03-04 NOTE — ED Triage Notes (Signed)
Pt BIB EMS from home due to N&V. Per EMS they was called bc he was having a headache& N&V. When EMS arrived he was unresponsive. Once EMS arrive patient went unresponsive. Pt intubated on arrival.

## 2020-03-05 ENCOUNTER — Inpatient Hospital Stay (HOSPITAL_COMMUNITY): Payer: BLUE CROSS/BLUE SHIELD

## 2020-03-05 DIAGNOSIS — J9601 Acute respiratory failure with hypoxia: Secondary | ICD-10-CM

## 2020-03-05 DIAGNOSIS — E876 Hypokalemia: Secondary | ICD-10-CM | POA: Diagnosis not present

## 2020-03-05 DIAGNOSIS — I619 Nontraumatic intracerebral hemorrhage, unspecified: Secondary | ICD-10-CM | POA: Diagnosis not present

## 2020-03-05 DIAGNOSIS — J9602 Acute respiratory failure with hypercapnia: Secondary | ICD-10-CM

## 2020-03-05 LAB — PATHOLOGIST SMEAR REVIEW

## 2020-03-05 LAB — SODIUM
Sodium: 157 mmol/L — ABNORMAL HIGH (ref 135–145)
Sodium: 160 mmol/L — ABNORMAL HIGH (ref 135–145)
Sodium: 167 mmol/L (ref 135–145)
Sodium: 166 mmol/L (ref 135–145)

## 2020-03-05 MED ORDER — LABETALOL HCL 5 MG/ML IV SOLN
10.0000 mg | INTRAVENOUS | Status: DC | PRN
  Administered 2020-03-05 – 2020-03-06 (×3): 10 mg via INTRAVENOUS
  Filled 2020-03-05 (×3): qty 4

## 2020-03-05 NOTE — Consult Note (Signed)
NAME:  Fred Brown, MRN:  [id number], DOB:  [birth year], LOS: 1 ADMISSION DATE:  03/19/2020, CONSULTATION DATE: 03/25/2020 REFERRING MD: Lynnae Sandhoff MD, CHIEF COMPLAINT:  Cerebellar hemorrhage, vent management  Brief History:  55 year old with history of hypertension, TIA, diabetes, OSA not on CPAP presenting with acute cerebellar hemorrhage with intraventricular extension, mechanical ventilated due to altered mental status.  Not a candidate for intervention per neurosurgery.  He has poor prognosis for meaningful recovery but family is requesting full code. PCCM consulted for vent management  Past Medical History:    has a past medical history of Ascending aorta dilatation ([place]) (02/25/2015), Essential hypertension (02/25/2015), History of TIA (transient ischemic attack) (03/05/2015), Sleep apnea, Stroke [facility]), and Type 2 diabetes mellitus ([place]) (03/06/2015).  Significant Hospital Events:  2/3-admit  Consults:  PCCM, neurosurgery  Procedures:  ETT 2/3 >  Significant Diagnostic Tests:  CT head 03/19/2020-large cerebellar hematoma with diffuse intraventricular extension, cerebellar edema with mass-effect on the cerebellum and brainstem.  Obstructive hydrocephalus.  Micro Data:    Antimicrobials:    Interim History / Subjective:    Objective   Blood pressure 122/88, pulse (!) 127, temperature (!) 103.8 F (39.9 C), temperature source Axillary, resp. rate 20, height '5\' 6"'  (1.676 m), weight 77.8 kg, SpO2 100 %.    Vent Mode: PRVC FiO2 (%):  [50 %-100 %] 50 % Set Rate:  [16 bmp] 16 bmp Vt Set:  [500 mL-510 mL] 510 mL PEEP:  [5 cmH20] 5 cmH20 Plateau Pressure:  [16 cmH20-18 cmH20] 16 cmH20   Intake/Output Summary (Last 24 hours) at 03/05/2020 1013 Last data filed at 03/05/2020 0600 Gross per 24 hour  Intake 3203.19 ml  Output 7150 ml  Net -3946.81 ml   Filed Weights   03/28/2020 1020  Weight: 77.8 kg    Examination: Blood pressure 119/74, pulse (!) 103, temperature 99.9 F (37.7 C),  temperature source Oral, resp. rate 20, height '5\' 6"'  (1.676 m), weight 77.8 kg, SpO2 100 %. Gen:      No acute distress HEENT:  Fixed pupils at 89m non reactive to light, sclera anicteric Neck:     No masses; ETT Lungs:    Clear to auscultation bilaterally; normal respiratory effort CV:         Regular rate and rhythm; no murmurs Abd:      + bowel sounds; soft, non-tender; no palpable masses, no distension Ext:    No edema; adequate peripheral perfusion Skin:      Warm and dry; no rash Neuro: Does cough in response to gag. Pupils are fixed at 2 mm bilaterally, non reactive to light. Does not blink to threat.   Resolved Hospital Problem list     Assessment & Plan:  Acute respiratory failure Intubated for airway protection in the setting of stroke ABG, chest x-ray reviewed Continue full vent support for now pending family conversation No plans on weaning due to poor mental status  Cerebellar hemorrhage Mannitol and hypertonic saline per neuro recs Cardene for goals SBP < 140  Best practice (evaluated daily)  Diet: NPO Pain/Anxiety/Delirium protocol (if indicated): Not on sedation VAP protocol (if indicated): Ordered DVT prophylaxis: SCDs GI prophylaxis: PPI Glucose control: Monitor Mobility: Bed Disposition: ICU  Goals of Care:  Last date of multidisciplinary goals of care discussion: Pending Family and staff present: Wife at bedside Summary of discussion: Per primary team Follow up goals of care discussion due: 03/11/20 Code Status: Full  Labs   CBC: Recent Labs  Lab 03/16/2020 1016  03/12/2020 1029 03/17/2020 1111  WBC 15.6*  --   --   NEUTROABS 5.0  --   --   HGB 15.7 16.7 10.2*  HCT 48.4 49.0 30.0*  MCV 79.6*  --   --   PLT 316  --   --     Basic Metabolic Panel: Recent Labs  Lab 03/07/2020 1016 03/22/2020 1029 03/26/2020 1040 03/05/2020 1111 03/13/2020 1800 03/03/2020 2243 03/05/20 0523  NA 139 139  --  146* 147* 149* 157*  K 2.7* 2.8*  --  2.5*  --   --   --   CL  104 107  --   --   --   --   --   CO2 22  --   --   --   --   --   --   GLUCOSE 253* 259*  --   --   --   --   --   BUN 14 19  --   --   --   --   --   CREATININE 1.12 0.90  --   --   --   --   --   CALCIUM 9.6  --   --   --   --   --   --   MG  --   --  1.7  --   --   --   --   PHOS  --   --   --   --  2.6  --   --    GFR: Estimated Creatinine Clearance: 93.2 mL/min (by C-G formula based on SCr of 0.9 mg/dL). Recent Labs  Lab 03/11/2020 1016  WBC 15.6*    Liver Function Tests: Recent Labs  Lab 03/22/2020 1016  AST 26  ALT 25  ALKPHOS 65  BILITOT 0.8  PROT 7.8  ALBUMIN 4.1   No results for input(s): LIPASE, AMYLASE in the last 168 hours. No results for input(s): AMMONIA in the last 168 hours.  ABG    Component Value Date/Time   PHART 7.291 (L) 03/13/2020 1111   PCO2ART 40.9 03/29/2020 1111   PO2ART 283 (H) 03/28/2020 1111   HCO3 19.7 (L) 03/15/2020 1111   TCO2 21 (L) 03/08/2020 1111   ACIDBASEDEF 6.0 (H) 03/23/2020 1111   O2SAT 100.0 03/25/2020 1111     Coagulation Profile: Recent Labs  Lab 03/16/2020 1016  INR 1.0    Cardiac Enzymes: No results for input(s): CKTOTAL, CKMB, CKMBINDEX, TROPONINI in the last 168 hours.  HbA1C: HbA1c, POC (controlled diabetic range)  Date/Time Value Ref Range Status  08/11/2019 10:42 AM 6.8 0.0 - 7.0 % Final  03/10/2019 10:20 AM 6.5 0.0 - 7.0 % Final   Hgb A1c MFr Bld  Date/Time Value Ref Range Status  03/03/2020 10:41 AM 7.5 (H) 4.8 - 5.6 % Final    Comment:    (NOTE) Pre diabetes:          5.7%-6.4%  Diabetes:              >6.4%  Glycemic control for   <7.0% adults with diabetes     CBG: Recent Labs  Lab 03/25/2020 1020  GLUCAP 248*    Review of Systems:   Unable to obtain due to altered mental status  Past Medical History:  He,  has a past medical history of Ascending aorta dilatation ([place]) (02/25/2015), Essential hypertension (02/25/2015), History of TIA (transient ischemic attack) (03/05/2015), Sleep apnea,  Stroke ([place]), and Type 2 diabetes mellitus (  [place]) (03/06/2015).   Surgical History:  History reviewed. No pertinent surgical history.   Social History:   reports that he has never smoked. He has never used smokeless tobacco. He reports that he does not drink alcohol and does not use drugs.   Family History:  His family history is negative for Colon cancer, Esophageal cancer, Stomach cancer, and Rectal cancer.   Allergies No Known Allergies   Home Medications  Prior to Admission medications   Medication Sig Start Date End Date Taking? Authorizing Provider  ADVIL 200 MG tablet Take 400-800 mg by mouth every 6 (six) hours as needed (for headaches).   Yes [provider]  aspirin 325 MG tablet Take 1 tablet (325 mg total) by mouth daily. 06/21/15  Yes Charlott Rakes, MD  atorvastatin (LIPITOR) 20 MG tablet Take 1 tablet (20 mg total) by mouth daily at 6 PM. 08/11/19  Yes Newlin, Enobong, MD  carvedilol (COREG) 6.25 MG tablet Take 1 tablet (6.25 mg total) by mouth 2 (two) times daily with a meal. 08/11/19  Yes Newlin, Enobong, MD  losartan-hydrochlorothiazide (HYZAAR) 100-25 MG tablet Take 1 tablet by mouth daily. 08/11/19  Yes Charlott Rakes, MD  metFORMIN (GLUCOPHAGE) 500 MG tablet Take 1 tablet (500 mg total) by mouth daily with breakfast. 08/11/19  Yes Newlin, Enobong, MD  omeprazole (PRILOSEC) 20 MG capsule Take 1 capsule (20 mg total) by mouth daily. 08/11/19  Yes Charlott Rakes, MD  Blood Glucose Monitoring Suppl (ONE TOUCH ULTRA 2) w/Device KIT Use as directed 10/05/15   Charlott Rakes, MD  glucose blood (ONE TOUCH ULTRA TEST) test strip Use as instructed 09/27/15   Charlott Rakes, MD  Lancets (ONETOUCH ULTRASOFT) lancets Use as instructed 10/05/15   Charlott Rakes, MD  methocarbamol (ROBAXIN) 500 MG tablet Take 2 tablets (1,000 mg total) by mouth at bedtime as needed for muscle spasms. 03/10/19   Charlott Rakes, MD  naproxen (NAPROSYN) 500 MG tablet Take 1 tablet (500 mg total) by  mouth 2 (two) times daily. 08/29/19   Recardo Evangelist, PA-C  Olopatadine HCl (PATADAY) 0.2 % SOLN PLACE 1 DROP INTO BOTH EYES 2(TWO) TIMES DAILY. Patient taking differently: Place 1 drop into both eyes 2 (two) times daily. 11/13/16   Charlott Rakes, MD     Critical care time:    Vanessa Kick, [place]

## 2020-03-05 NOTE — CV Procedure (Signed)
Echocardiogram attempted but HR too high. Will try again when HR is normalized.

## 2020-03-05 NOTE — Progress Notes (Signed)
STROKE TEAM PROGRESS NOTE   INTERVAL HISTORY Pt is examined at the bedside.  Wife present at the bedside.  Patient presented yesterday with thunderclap headache, slurred speech, altered mental status, nausea and vomiting.  Due to his decline in mental status he was intubated by EMS and put on ventilator. CT head shows large cerebellar hematoma with diffuse intraventricular extension and cerebellar edema.  There is marked mass-effect on the cerebellum and brainstem with obstructive hydrocephalus. Neurosurgery does not recommend any intervention as there are no chances of meaningful recovery. Today, Temperature 103.8 F, tachycardic at 124, BP stable.  On neuro examination, patient is not responding to noxious stimuli, corneal reflexes are absent.  With the help of interpreter, explained to wife about his poor prognosis and CT films were shown.  All of her questions were answered.  Relatives also present on phone call.  Wife was explained that it would be in his best interest to stop the ventilator today or by tomorrow morning because of his poor prognosis. She understands that but want to wait for his immediate family members to be here. She states that his older children and other relatives want to see him before the ventilator is disconnected.  Wife was also explained that as there are no chances of survival so it would be better to change his code status from Full code to DNR. She understands and agrees to it.  Vitals:   03/05/20 0600 03/05/20 0728 03/05/20 0800 03/05/20 1125  BP: 122/88     Pulse: (!) 122 (!) 127  (!) 124  Resp: 17 20  18   Temp:   (!) 103.8 F (39.9 C)   TempSrc:   Axillary   SpO2: 100% 100%  100%  Weight:      Height:       CBC:  Recent Labs  Lab 03-30-2020 1016 03-30-2020 1029 30-Mar-2020 1111  WBC 15.6*  --   --   NEUTROABS 5.0  --   --   HGB 15.7 16.7 10.2*  HCT 48.4 49.0 30.0*  MCV 79.6*  --   --   PLT 316  --   --    Basic Metabolic Panel:  Recent Labs  Lab  03/30/20 1016 Mar 30, 2020 1029 March 30, 2020 1040 03-30-2020 1111 2020-03-30 1800 03/30/20 2243 03/05/20 0523  NA 139 139  --  146* 147* 149* 157*  K 2.7* 2.8*  --  2.5*  --   --   --   CL 104 107  --   --   --   --   --   CO2 22  --   --   --   --   --   --   GLUCOSE 253* 259*  --   --   --   --   --   BUN 14 19  --   --   --   --   --   CREATININE 1.12 0.90  --   --   --   --   --   CALCIUM 9.6  --   --   --   --   --   --   MG  --   --  1.7  --   --   --   --   PHOS  --   --   --   --  2.6  --   --    Lipid Panel:  Recent Labs  Lab 03/30/2020 1040  CHOL 201*  TRIG 115  HDL  42  CHOLHDL 4.8  VLDL 23  LDLCALC 696*   HgbA1c:  Recent Labs  Lab Mar 30, 2020 1041  HGBA1C 7.5*   Urine Drug Screen:  Recent Labs  Lab 2020-03-30 1230  LABOPIA NONE DETECTED  COCAINSCRNUR NONE DETECTED  LABBENZ NONE DETECTED  AMPHETMU NONE DETECTED  THCU NONE DETECTED  LABBARB NONE DETECTED    Alcohol Level  Recent Labs  Lab 03/30/2020 1800  ETH <10    IMAGING past 24 hours No results found.  PHYSICAL EXAM Limited examination because of AMS and Pt is on ventilator.  GENERAL: Very ill Appearing asian male. Intubated.  Not responding to voice commands and noxious stimuli.  HEENT: - Pupils midline, fixed,  Corneal reflex absent. Not following commands.  LUNGS - On ventilator CV - No Peripheral Edema ABDOMEN - Soft,  nondistended  Ext: warm, well perfused, no  Peripheral edema  NEURO Exam:   Mental Status:Limited examination because of comatose state.  Patient is intubated not sedated.  He is comatose.  He is unresponsive.  Does not follow any commands. Cranial Nerves: B/L Pupils small midline and fixed,  No dolls eyes movements appreciated. Corneal reflex absent.  Doll's eye movements are absent.  He does have a cough and gag.  He does make some respiratory effort.   Motor: Faccid tone, No motor activity on noxious stimuli b/l. Tone: is normal and bulk is normal.  Sensation- Doesn't withdraw or  moves on noxious stimuli.  Coordination:Not able to test due to comatose state.     ASSESSMENT/PLAN Mr. Fred Brown is a 55 y.o. male with history of, TIA, type 2 diabetes mellitus, OSA not on CPAP presented on 03/05/20 to ED with thunderclap headache, slurred speech, altered mental status, nausea and vomiting.  Due to his decline in mental status he was intubated by EMS en route to ED. CT head shows large cerebellar hematoma with diffuse intraventricular extension and cerebellar edema.  There is marked mass-effect on the cerebellum and brainstem with obstructive hydrocephalus. Neurosurgery does not recommend any intervention as there are no chances of meaningful recovery. His BP was elevated at 224/140 mmHg at admission. Very poor prognosis due to cerebellar herniation, cerebral edema and mass effects on brain stem. Etiology of hemorrhage  likely due to hypertensive emergency vs aneurysm. On neuro examination Today, Pt is on ventilator, not responding to noxious stimuli, corneal reflexes are absent. Discussed poor prognosis and stopping ventilator with Wife and Wife understands but want to wait for his immediate family members to be here before stopping ventilator/ She also agrees to changing code status to DNR.   Intracranial Hemophage (Large cerebellar Hematoma  With mass effect on the cerebellum and brainstem with obstructive hydrocephalus) Etiology likely Hypertension.  Cerebral Edema and brain herniation Mass effects on Brain Stem Obstructive Hydrocephalous   Code Stroke CT head - Large cerebellar hematoma with diffuse intraventricular extension and cerebellar edema. There is marked mass effect on the cerebellum and brainstem with obstructive hydrocephalus  CTA head & neck - Not ordered  CT Angio Chest- ordered  CT Angio Chest Aorta - Ordered, Pending  CT Angio Chest PE w or wo  Contrast ordered, Pendinh  CT perfusion - Not ordered   MRI - Not ordered   MRA  Not ordered   Carotid  Doppler   Not ordered   2D Echo - Scheduled, will cancel   LDL 136  HgbA1c 7.5  VTE prophylaxis - SCD's    Diet   Diet NPO time specified  aspirin 325 mg daily prior to admission, now on None  Therapy recommendations:  Pending  Disposition:  TBD   Hypertension (Uncontrolled)  Home meds:  Hyzaar 100-25 mg Daily, Coreg 6.25 mg BID  Bp elevated at Admission. Stable Now.  . Permissive hypertension between 120- 140 mmHg . Continue Nicardipine.   Hyperlipidemia  Home meds: Lipitor 20 mg daily (Held)  LDL 136, goal < 70  Diabetes type II Uncontrolled  Home meds: Metformin 500 mg daily  HgbA1c 7.5, goal < 7.0  CBGs Recent Labs    03/18/2020 1020  GLUCAP 248*     Aortic aneurysm and Ascending aortic dilation As per 2019 CT Chest- Stable aneurysmal dilatation of the ascending aorta at 4.2 cm. There is no evidence of dissection or intramural hematoma. Recommend annual imaging followup by CTA or MRA. Stable per office visit note by Dr. Alvis Lemmings 08/11/19)  Other Stroke Risk Factors  Hx TIA- On Aspirin  Obstructive sleep apnea, Not on CPAP at home    Hospital day # 1  I have personally obtained history,examined this patient, reviewed notes, independently viewed imaging studies, participated in medical decision making and plan of care.ROS completed by me personally and pertinent positives fully documented  I have made any additions or clarifications directly to the above note. Agree with note above.  Patient unfortunately has a massive cerebellar hemorrhage with significant cytotoxic edema and mass-effect on the brainstem with hydrocephalus and a poor neurological exam which is inching towards brain death situation.  He is unlikely to survive even with prolonged ventilatory support and make meaningful improvement to live life with any faulty.  I had a long discussion with the patient's wife using [place] language interpreter at the bedside and also spoke to his nephew  and niece over the phone and answered questions about his prognosis.  Family has agreed to DNR but want to continue ventilatory support till other family members have come and seen him and hopefully will plan to do this tomorrow.  I explained to him with his rapidly worsening situation he may not survive till tomorrow and they understand and agree with DNR.  Discussed with Dr. Merrily Pew critical care medicine This patient is critically ill and at significant risk of neurological worsening, death and care requires constant monitoring of vital signs, hemodynamics,respiratory and cardiac monitoring, extensive review of multiple databases, frequent neurological assessment, discussion with family, other specialists and medical decision making of high complexity.I have made any additions or clarifications directly to the above note.This critical care time does not reflect procedure time, or teaching time or supervisory time of PA/NP/Med Resident etc but could involve care discussion time.  I spent 50 minutes of neurocritical care time  in the care of  this patient.     Delia Heady, MD Medical Director [facility] Stroke Center Pager: [phone] 03/05/2020 2:46 PM    To contact Stroke Continuity provider, please refer to [email or web address]. After hours, contact General Neurology

## 2020-03-05 NOTE — Progress Notes (Signed)
Called to bedside re: concern over cuff leak.  No cuff leak auscultated or evident by waveforms on ventilator.  However, large discrepancy noted between vti and vte.  Chest rise remained adequate, breath sounds were clearly evident, and exhaled volumes were consistent with previous assessments.  Ventilator was only detecting ~42mL of input, however.   Ventilator exchanged and issue was resolved.

## 2020-03-05 NOTE — Progress Notes (Signed)
Dr. Derry Lory of  Neurology made aware that this RN had to restart cardene drip, and patient continues to stay above blood pressure goal of SBP 140, despite cardene running at max dose. MD stated he will put in orders to change BP goal to SBP 140-160, as well as put in an order for a PRN blood pressure medication. Will continue to monitor.

## 2020-03-05 NOTE — Progress Notes (Signed)
OT Cancellation Note  Patient Details Name: Fred Brown MRN: [id number] DOB: Jan 20, 1966   Cancelled Treatment:    Reason Eval/Treat Not Completed: Patient not medically ready;Active bedrest order/ intubated. OT order received and appreciated however this conflicts with current bedrest order set. Please increase activity tolerance as appropriate and remove bedrest from orders. . Please contact OT at [phone] if bed rest order is discontinued. OT will hold evaluation at this time and will check back as time allows pending increased activity orders.   Wynona Neat, OTR/L  Acute Rehabilitation Services Pager: [phone] Office: [phone] .  03/05/2020, 9:59 AM

## 2020-03-05 NOTE — Progress Notes (Signed)
SLP Cancellation Note  Patient Details Name: Crespin Forstrom MRN: [id number] DOB: 03/03/1965   Cancelled treatment:        Orders for cognitive linguistic evaluation received and appreciated.  Pt is intubated at this time and is unable to participate in assessment.  SLP will follow for medical readiness for evaluation.  Consider swallow evaluation once pt has been extubated.    Kerrie Pleasure, MA, CCC-SLP Acute Rehabilitation Services Office: [phone] 03/05/2020, 9:28 AM

## 2020-03-05 NOTE — Progress Notes (Signed)
Nutrition Brief Note  Chart reviewed. Pt admitted with large L cerebellar hemorrhage with intraventricular extension. Per MD pt with 100% mortality risk. Palliative care consulted due to poor prognosis.  No nutrition interventions planned at this time.  Please re-consult as needed.   Cammy Copa., RD, LDN, CNSC See AMiON for contact information

## 2020-03-05 NOTE — Progress Notes (Signed)
PT Cancellation Note  Patient Details Name: Fred Brown MRN: [id number] DOB: 01-29-1966   Cancelled Treatment:    Reason Eval/Treat Not Completed: Patient not medically ready.  Pt still with active bedrest orders/intubated.  Per RN Hold therapies today.  Will see as able 2/5 for the evaluation. 03/05/2020  Jacinto Halim., PT Acute Rehabilitation Services [phone]  (pager) [phone]  (office)    Eliseo Gum Riaan [place] 03/05/2020, 1:27 PM

## 2020-03-06 DIAGNOSIS — G911 Obstructive hydrocephalus: Secondary | ICD-10-CM | POA: Diagnosis not present

## 2020-03-06 DIAGNOSIS — I615 Nontraumatic intracerebral hemorrhage, intraventricular: Secondary | ICD-10-CM

## 2020-03-06 DIAGNOSIS — I614 Nontraumatic intracerebral hemorrhage in cerebellum: Principal | ICD-10-CM

## 2020-03-06 DIAGNOSIS — J9601 Acute respiratory failure with hypoxia: Secondary | ICD-10-CM | POA: Diagnosis not present

## 2020-03-06 LAB — SODIUM: Sodium: 165 mmol/L (ref 135–145)

## 2020-03-06 MED ORDER — MORPHINE BOLUS VIA INFUSION
2.0000 mg | Freq: Once | INTRAVENOUS | Status: AC
Start: 2020-03-06 — End: 2020-03-06
  Administered 2020-03-06: 2 mg via INTRAVENOUS
  Filled 2020-03-06: qty 2

## 2020-03-06 MED ORDER — ONDANSETRON 4 MG PO TBDP: 4.0000 mg | ORAL_TABLET | Freq: Four times a day (QID) | ORAL | Status: DC | PRN

## 2020-03-06 MED ORDER — HALOPERIDOL LACTATE 2 MG/ML PO CONC
0.5000 mg | ORAL | Status: DC | PRN
  Filled 2020-03-06: qty 0.3

## 2020-03-06 MED ORDER — MORPHINE 100MG IN NS 100ML (1MG/ML) PREMIX INFUSION
1.0000 mg/h | INTRAVENOUS | Status: DC
  Administered 2020-03-06: 1 mg/h via INTRAVENOUS
  Filled 2020-03-06: qty 100

## 2020-03-06 MED ORDER — ACETAMINOPHEN 650 MG RE SUPP: 650.0000 mg | Freq: Four times a day (QID) | RECTAL | Status: DC | PRN

## 2020-03-06 MED ORDER — GLYCOPYRROLATE 0.2 MG/ML IJ SOLN: 0.2000 mg | INTRAMUSCULAR | Status: DC | PRN

## 2020-03-06 MED ORDER — HALOPERIDOL LACTATE 5 MG/ML IJ SOLN: 0.5000 mg | INTRAMUSCULAR | Status: DC | PRN

## 2020-03-06 MED ORDER — HALOPERIDOL 0.5 MG PO TABS
0.5000 mg | ORAL_TABLET | ORAL | Status: DC | PRN
  Filled 2020-03-06: qty 1

## 2020-03-06 MED ORDER — GLYCOPYRROLATE 1 MG PO TABS
1.0000 mg | ORAL_TABLET | ORAL | Status: DC | PRN
  Filled 2020-03-06: qty 1

## 2020-03-06 MED ORDER — BIOTENE DRY MOUTH MT LIQD: 15.0000 mL | OROMUCOSAL | Status: DC | PRN

## 2020-03-06 MED ORDER — ACETAMINOPHEN 325 MG PO TABS: 650.0000 mg | ORAL_TABLET | Freq: Four times a day (QID) | ORAL | Status: DC | PRN

## 2020-03-06 MED ORDER — ONDANSETRON HCL 4 MG/2ML IJ SOLN: 4.0000 mg | Freq: Four times a day (QID) | INTRAMUSCULAR | Status: DC | PRN

## 2020-03-06 MED ORDER — POLYVINYL ALCOHOL 1.4 % OP SOLN
1.0000 [drp] | Freq: Four times a day (QID) | OPHTHALMIC | Status: DC | PRN
  Filled 2020-03-06: qty 15

## 2020-03-30 NOTE — Progress Notes (Signed)
Patient's only belonging is his wedding band. Spoke to wife and interpreter, wife requests ring NOT be removed.

## 2020-03-30 NOTE — Progress Notes (Signed)
PT Cancellation Note  Patient Details Name: Fred Brown MRN: [id number] DOB: 07/15/1965   Cancelled Treatment:    Reason Eval/Treat Not Completed: Medical issues which prohibited therapy. Pt with poor prognosis and has transitioned to comfort care. PT will sign off at this time to align with family goals and plan of care, thank you for the consult.   Rolm Baptise, PT, DPT   Acute Rehabilitation Department Pager #: [phone]   Gaetana Michaelis 03/05/2020, 8:15 AM

## 2020-03-30 NOTE — Death Summary Note (Signed)
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 year old male with history of, TIA, type 2 diabetes mellitus, OSA not on CPAP presented on 03/05/20 to ED with thunderclap headache, slurred speech, altered mental status, nausea and vomiting.  Due to his decline in mental status he was intubated by EMS en route to ED. CT head shows large cerebellar hematoma with diffuse intraventricular extension and cerebellar edema.  There is marked mass-effect on the cerebellum and brainstem with obstructive hydrocephalus. Neurosurgery does not recommend any intervention as there are no chances of meaningful recovery. His BP was elevated at 224/140 mmHg at admission. Very poor prognosis due to cerebellar herniation, cerebral edema and mass effects on brain stem. Etiology of hemorrhage  likely due to hypertensive emergency vs aneurysm. On neuro examination 27-Mar-2020, Pt is on ventilator, not responding to noxious stimuli, corneal reflexes are absent. No dolls eyes movements appreciated. Absent cough and gag.  He does make some respiratory effort. Later in the morning, Family decided to withdraw care. End of life orders were placed. Pt was extubated to comfort at Approx. 1045. Pt absent of all vitals signs. TOD called at 1132 which was confirmed by two RN's.   ICH - Large cerebellar Hematoma with IVH and mass effect on the brainstem with  obstructive hydrocephalus - Etiology likely Hypertension.   Code Stroke CT head - Large cerebellar hematoma with diffuse intraventricular extension and cerebellar edema. There is marked mass effect on the cerebellum and brainstem with obstructive hydrocephalus  2D Echo -  cancelled  LDL 136  HgbA1c 7.5  VTE prophylaxis - SCD's  aspirin 325 mg daily prior to admission, now on None  Disposition: given the poor prognosis, pt wife and family members have been informed. They made decision for comfort care measures. End of life orders were placed. Pt was extubated to comfort at Approx. 1045. Pt absent of all vitals signs. TOD called at 1132 which was confirmed by two RN's.   Cerebral Edema and brain herniation Mass effects on Brainstem Obstructive Hydrocephalous  Was on 3% saline, now off given the poor prognosis and comfort care measures  Hypertension (Uncontrolled)  Home meds:  Hyzaar 100-25 mg Daily, Coreg 6.25 mg BID  Bp elevated at Admission.   Hyperlipidemia  Home meds: Lipitor 20 mg daily  LDL 136   Diabetes type II Uncontrolled  Home meds: Metformin 500 mg daily  HgbA1c 7.5, goal < 7.0  Aortic aneurysm and Ascending aortic dilation  As per May 30, 2017 CT Chest- Stable aneurysmal dilatation of the ascending aorta at 4.2cm. There is no evidence of dissection or intramural hematoma.    Other Stroke Risk Factors  Hx TIA - On Aspirin  Obstructive sleep apnea, Not on CPAP at home   Pertinent Labs and Studies  Significant Diagnostic Studies DG Chest Port 1 View  Result Date: 03-25-20 CLINICAL DATA:  Stroke status post intubation. EXAM: PORTABLE CHEST 1 VIEW COMPARISON:  Chest radiograph August 29, 2019 FINDINGS: Endotracheal tube with tip overlying the midthoracic trachea. Nasogastric tube coursing below the diaphragm with tip overlying stomach. Left-sided defibrillator pads. EKG leads and cardiac marked ring device overlie the chest. The heart size and mediastinal  contours are partially obscured. The visualized skeletal structures are unremarkable. IMPRESSION: Endotracheal tube with tip overlying the midthoracic trachea. Nasogastric tube coursing below the diaphragm with tip overlying stomach and side port likely at the GE junction recommend 3 cm advanced. Electronically Signed   By: Maudry Mayhew MD   On: 03/15/2020 10:42   CT HEAD CODE STROKE WO CONTRAST  Result Date: 03/12/2020 CLINICAL DATA:  Code stroke.  Acute neuro deficit. EXAM: CT HEAD WITHOUT CONTRAST TECHNIQUE: Contiguous axial images were obtained from the base of the skull through the vertex without intravenous contrast. COMPARISON:  02/25/2015 FINDINGS: Brain: Large dissecting hematoma in the left more than right cerebellum with fourth ventricular extension extending superiorly into the bilateral lateral ventricles. The hematoma and adjacent edema compresses the cerebellum and brainstem with obstructive hydrocephalus. Chronic small vessel ischemic changes in the cerebral white matter. Chronic lacunar infarcts with seen on a comparison imaging. There is a small high-density focus in the left frontal white matter measuring 5 mm, chronic when compared to 2017 gradient image. Vascular: No acute finding Skull: Negative Sinuses/Orbits: Negative Other: Critical Value/emergent results were called by telephone at the time of interpretation on 03/24/2020 at 10:46 am to provider Thomasena Edis, who is already aware. ASPECTS [facility] Stroke Program Early CT Score) Not scored in this setting IMPRESSION: 1. Large cerebellar hematoma with diffuse intraventricular extension and cerebellar edema. There is marked mass effect on the cerebellum and brainstem with obstructive hydrocephalus. 2. Underlying chronic small vessel disease with remote small vessel infarcts. Electronically Signed   By: Marnee Spring M.D.   On: 03/02/2020 10:47    Microbiology Recent Results (from the past 240 hour(s))  SARS Coronavirus 2 by RT PCR  (hospital order, performed in [facility] hospital lab) Nasopharyngeal Nasopharyngeal Swab     Status: None   Collection Time: 03/12/2020 11:13 AM   Specimen: Nasopharyngeal Swab  Result Value Ref Range Status   SARS Coronavirus 2 NEGATIVE NEGATIVE Final    Comment: (NOTE) SARS-CoV-2 target nucleic acids are NOT DETECTED.  The SARS-CoV-2 RNA is generally detectable in upper and lower respiratory specimens during the acute phase of infection. The lowest concentration of SARS-CoV-2 viral copies this assay can detect is 250 copies / mL. A negative result does not preclude SARS-CoV-2 infection and should not be used as the sole basis for treatment or other patient management decisions.  A negative result may occur with improper specimen collection / handling, submission of specimen other than nasopharyngeal swab, presence of viral mutation(s) within the areas targeted by this assay, and inadequate number of viral copies (<250 copies / mL). A negative result must be combined with clinical observations, patient history, and epidemiological information.  Fact Sheet for Patients:   BoilerBrush.com.cy  Fact Sheet for Healthcare Providers: https://pope.com/  This test is not yet approved or  cleared by the [place] FDA and has been authorized for detection and/or diagnosis of SARS-CoV-2 by FDA under an Emergency Use Authorization (EUA).  This EUA will remain in effect (meaning this test can be used) for the duration of the COVID-19 declaration under Section 564(b)(1) of the Act, 21 U.S.C. section 360bbb-3(b)(1), unless the authorization is terminated or revoked sooner.  Performed at [facility], [address], [place], [place] [address]   MRSA PCR Screening     Status: None   Collection Time: 03-07-20  5:51 PM   Specimen: Nasopharyngeal  Result Value Ref Range Status   MRSA by PCR NEGATIVE NEGATIVE Final    Comment:        The  GeneXpert MRSA Assay (FDA approved for NASAL specimens only), is one component of a comprehensive MRSA colonization surveillance program. It is not intended to diagnose MRSA infection nor to guide or monitor treatment for MRSA infections. Performed at [facility], [address], [place], [place] [address]     Lab Basic Metabolic Panel: Recent Labs  Lab 03-07-20 1016 07-Mar-2020 1029 03/07/20 1040 03-07-20 1111 March 07, 2020 1800 03-07-2020 2243 03/05/20 0523 03/05/20 1102 03/05/20 1616 03/05/20 2213 03/18/2020 0443  NA 139 139  --  146* 147*   < > 157* 160* 167* 166* 165*  K 2.7* 2.8*  --  2.5*  --   --   --   --   --   --   --   CL 104 107  --   --   --   --   --   --   --   --   --   CO2 22  --   --   --   --   --   --   --   --   --   --   GLUCOSE 253* 259*  --   --   --   --   --   --   --   --   --   BUN 14 19  --   --   --   --   --   --   --   --   --   CREATININE 1.12 0.90  --   --   --   --   --   --   --   --   --   CALCIUM 9.6  --   --   --   --   --   --   --   --   --   --   MG  --   --  1.7  --   --   --   --   --   --   --   --   PHOS  --   --   --   --  2.6  --   --   --   --   --   --    < > = values in this interval not displayed.   Liver Function Tests: Recent Labs  Lab 2020/03/07 1016  AST 26  ALT 25  ALKPHOS 65  BILITOT 0.8  PROT 7.8  ALBUMIN 4.1   No results for input(s): LIPASE, AMYLASE in the last 168 hours. No results for input(s): AMMONIA in the last 168 hours. CBC: Recent Labs  Lab 03/07/20 1016 07-Mar-2020 1029 Mar 07, 2020 1111  WBC 15.6*  --   --   NEUTROABS 5.0  --   --   HGB 15.7 16.7 10.2*  HCT 48.4 49.0 30.0*  MCV 79.6*  --   --   PLT 316  --   --    Cardiac Enzymes: No results for input(s): CKTOTAL, CKMB, CKMBINDEX, TROPONINI in the last 168 hours. Sepsis Labs: Recent Labs  Lab 2020-03-07 1016  WBC 15.6*    Procedures/Operations  none   Namine Beahm 03/26/2020, 4:18  PM

## 2020-03-30 NOTE — Progress Notes (Signed)
Patient extubated to comfort at approximately 1045. Family at bedside with family preacher. At 1132, patient absent of all vitals signs. Pupils fixed, nonreactive. TOD called at 1132, confirmed by second RN Milta Deiters.

## 2020-03-30 NOTE — Progress Notes (Signed)
STROKE TEAM PROGRESS NOTE   INTERVAL HISTORY Pt is examined at the bedside.  Wife and multiple other relatives present at the bedside.  On neuro examination, patient is not responding to noxious stimuli, corneal reflexes are absent, absent dolls eye movements. Gag and cough reflex were absent. Some respiratory drive was present. With the help of interpreter, Attending explained to wife about his poor prognosis. She states that she wants to wait for her preacher to come and do the prayer before disconnecting ventilator. Later in the morning, Family decided to withdraw care. End of life orders were placed. Pt was extubated to comfort at Approx. 1045. Pt absent of all vitals signs. TOD called at 1132 which was confirmed by two RN's.   Vitals:   09-Mar-2020 0815 09-Mar-2020 0830 03-09-20 0900 03-09-2020 1000  BP: 139/89 133/89 (!) 127/92 129/89  Pulse: (!) 103 (!) 103 (!) 105 (!) 105  Resp: 17 18 18 16   Temp:      TempSrc:      SpO2: 98% 99% 100% 100%  Weight:      Height:       CBC:  Recent Labs  Lab 03/14/2020 1016 03/20/2020 1029 03/05/2020 1111  WBC 15.6*  --   --   NEUTROABS 5.0  --   --   HGB 15.7 16.7 10.2*  HCT 48.4 49.0 30.0*  MCV 79.6*  --   --   PLT 316  --   --    Basic Metabolic Panel:  Recent Labs  Lab 03/07/2020 1016 03/26/2020 1029 03/03/2020 1040 03/13/2020 1111 03/11/2020 1800 03/19/2020 2243 03/05/20 2213 March 09, 2020 0443  NA 139 139  --  146* 147*   < > 166* 165*  K 2.7* 2.8*  --  2.5*  --   --   --   --   CL 104 107  --   --   --   --   --   --   CO2 22  --   --   --   --   --   --   --   GLUCOSE 253* 259*  --   --   --   --   --   --   BUN 14 19  --   --   --   --   --   --   CREATININE 1.12 0.90  --   --   --   --   --   --   CALCIUM 9.6  --   --   --   --   --   --   --   MG  --   --  1.7  --   --   --   --   --   PHOS  --   --   --   --  2.6  --   --   --    < > = values in this interval not displayed.   Lipid Panel:  Recent Labs  Lab 03/23/2020 1040  CHOL 201*  TRIG  115  HDL 42  CHOLHDL 4.8  VLDL 23  LDLCALC 05/02/20*   HgbA1c:  Recent Labs  Lab 03/13/2020 1041  HGBA1C 7.5*   Urine Drug Screen:  Recent Labs  Lab 03/24/2020 1230  LABOPIA NONE DETECTED  COCAINSCRNUR NONE DETECTED  LABBENZ NONE DETECTED  AMPHETMU NONE DETECTED  THCU NONE DETECTED  LABBARB NONE DETECTED    Alcohol Level  Recent Labs  Lab 03/24/2020 1800  ETH <  10    IMAGING past 24 hours No results found.  PHYSICAL EXAM Limited examination because of AMS and Pt is on ventilator.  GENERAL: Very ill Appearing asian male. Intubated.  Not responding to voice commands and noxious stimuli.  HEENT: - Pupils midline, fixed,  Corneal reflex absent. Absent dolls eye movements. Gag and cough reflex were absent.   Not following commands.  LUNGS - On ventilator, Some respiratory drive was present. CV - No Peripheral Edema ABDOMEN - Soft,  nondistended  Ext: warm, well perfused, no Peripheral edema  NEURO Exam:   Mental Status:Limited examination because of comatose state.  Patient is intubated not sedated.  He is comatose.  He is unresponsive.  Does not follow any commands. Cranial Nerves: B/L Pupils small midline and fixed,  No dolls eyes movements appreciated. Corneal reflex absent. Absent cough and gag.  He does make some respiratory effort.   Motor: Faccid tone, No motor activity on noxious stimuli b/l. Tone: is normal and bulk is normal.  Sensation- Doesn't withdraw or moves on noxious stimuli.  Coordination:Not able to test due to comatose state.    ASSESSMENT/PLAN Mr. Fred Brown is a 55 y.o. male with history of, TIA, type 2 diabetes mellitus, OSA not on CPAP presented on 03/05/20 to ED with thunderclap headache, slurred speech, altered mental status, nausea and vomiting.  Due to his decline in mental status he was intubated by EMS en route to ED. CT head shows large cerebellar hematoma with diffuse intraventricular extension and cerebellar edema.  There is marked mass-effect on the  cerebellum and brainstem with obstructive hydrocephalus. Neurosurgery does not recommend any intervention as there are no chances of meaningful recovery. His BP was elevated at 224/140 mmHg at admission. Very poor prognosis due to cerebellar herniation, cerebral edema and mass effects on brain stem. Etiology of hemorrhage  likely due to hypertensive emergency vs aneurysm. On neuro examination Today, Pt is on ventilator, not responding to noxious stimuli, corneal reflexes are absent. No dolls eyes movements appreciated. Absent cough and gag.  He does make some respiratory effort. Later in the morning, Family decided to withdraw care. End of life orders were placed. Pt was extubated to comfort at Approx. 1045. Pt absent of all vitals signs. TOD called at 1132 which was confirmed by two RN's.   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   Code Stroke CT head - Large cerebellar hematoma with diffuse intraventricular extension and cerebellar edema. There is marked mass effect on the cerebellum and brainstem with obstructive hydrocephalus  2D Echo -  cancelled  LDL 136  HgbA1c 7.5  VTE prophylaxis - SCD's  aspirin 325 mg daily prior to admission, now on None  Disposition: given the poor prognosis, pt wife and family members have been informed. They are leaning towards comfort care measures. Before terminal wean, they are asking for preacher for final prayer.   Cerebral Edema and brain herniation Mass effects on Brainstem Obstructive Hydrocephalous  Was on 3% saline, now off  Given the poor prognosis, family is leaning towards comfort care measures  Hypertension (Uncontrolled)  Home meds:  Hyzaar 100-25 mg Daily, Coreg 6.25 mg BID  Bp elevated at Admission.   Hyperlipidemia  Home meds: Lipitor 20 mg daily  LDL 136   Diabetes type II Uncontrolled  Home meds: Metformin 500 mg daily  HgbA1c  7.5, goal < 7.0  Aortic aneurysm and Ascending aortic dilation  As per 2019 CT Chest- Stable aneurysmal dilatation of the ascending aorta at 4.2cm. There is no evidence of dissection or intramural hematoma.    Other Stroke Risk Factors  Hx TIA - On Aspirin  Obstructive sleep apnea, Not on CPAP at home  Hospital day # 2    ATTENDING NOTE: I reviewed above note and agree with the assessment and plan. Pt was seen and examined.   55 year old male admitted for large cerebellar ICH with IVH and hydrocephalus.  Neurosurgery consulted no surgical intervention recommended given prognosis.  Patient was put on DNR yesterday after family discussion.  Today, patient wife and several other family members are at bedside.  One of the family member serves as Equities trader.  Patient still intubated, however unresponsive.  Neuro examination showed not open eyes on voice or pain stimulation, with forced eye opening, eyes mid position, bilateral pupil 3 mm, nonreactive to light.  No corneal reflexes, no gag and cough.  Patient still breathing over the vent with RR 18 over the vent setting 16.  No movement of all extremities on pain stimulation.  Again discussed with wife and other family members, patient prognosis is very poor, as allowable and currently near brain death.  Family expressed understanding, would like to have preacher to visit and then leaning towards to terminal wean and comfort care.  Marvel Plan, MD PhD Stroke Neurology 2020-03-10 3:08 PM  This patient is critically ill due to large cerebellar ICH with IVH and obstructive hydrocephalus and at significant risk of neurological worsening, death form brain herniation, brain death. This patient's care requires constant monitoring of vital signs, hemodynamics, respiratory and cardiac monitoring, review of multiple databases, neurological assessment, discussion with family, other specialists and medical decision making of high complexity. I spent 40  minutes of neurocritical care time in the care of this patient. I had long discussion with wife and other family members at bedside, updated pt current condition, treatment plan and poor prognosis, and answered all the questions.  They expressed understanding and appreciation.     To contact Stroke Continuity provider, please refer to [email or web address]. After hours, contact General Neurology

## 2020-03-30 DEATH — deceased

## 2020-12-30 ENCOUNTER — Other Ambulatory Visit: Payer: Self-pay

## 2022-02-10 ENCOUNTER — Other Ambulatory Visit (HOSPITAL_COMMUNITY): Payer: Self-pay

## 2022-12-11 IMAGING — DX DG CHEST 1V PORT
1 series · 1 of 1 positions shown · non-contrast
Comparison: Chest radiograph August 29, 2019

CLINICAL DATA: Stroke status post intubation.

EXAM:
PORTABLE CHEST 1 VIEW

[chest]
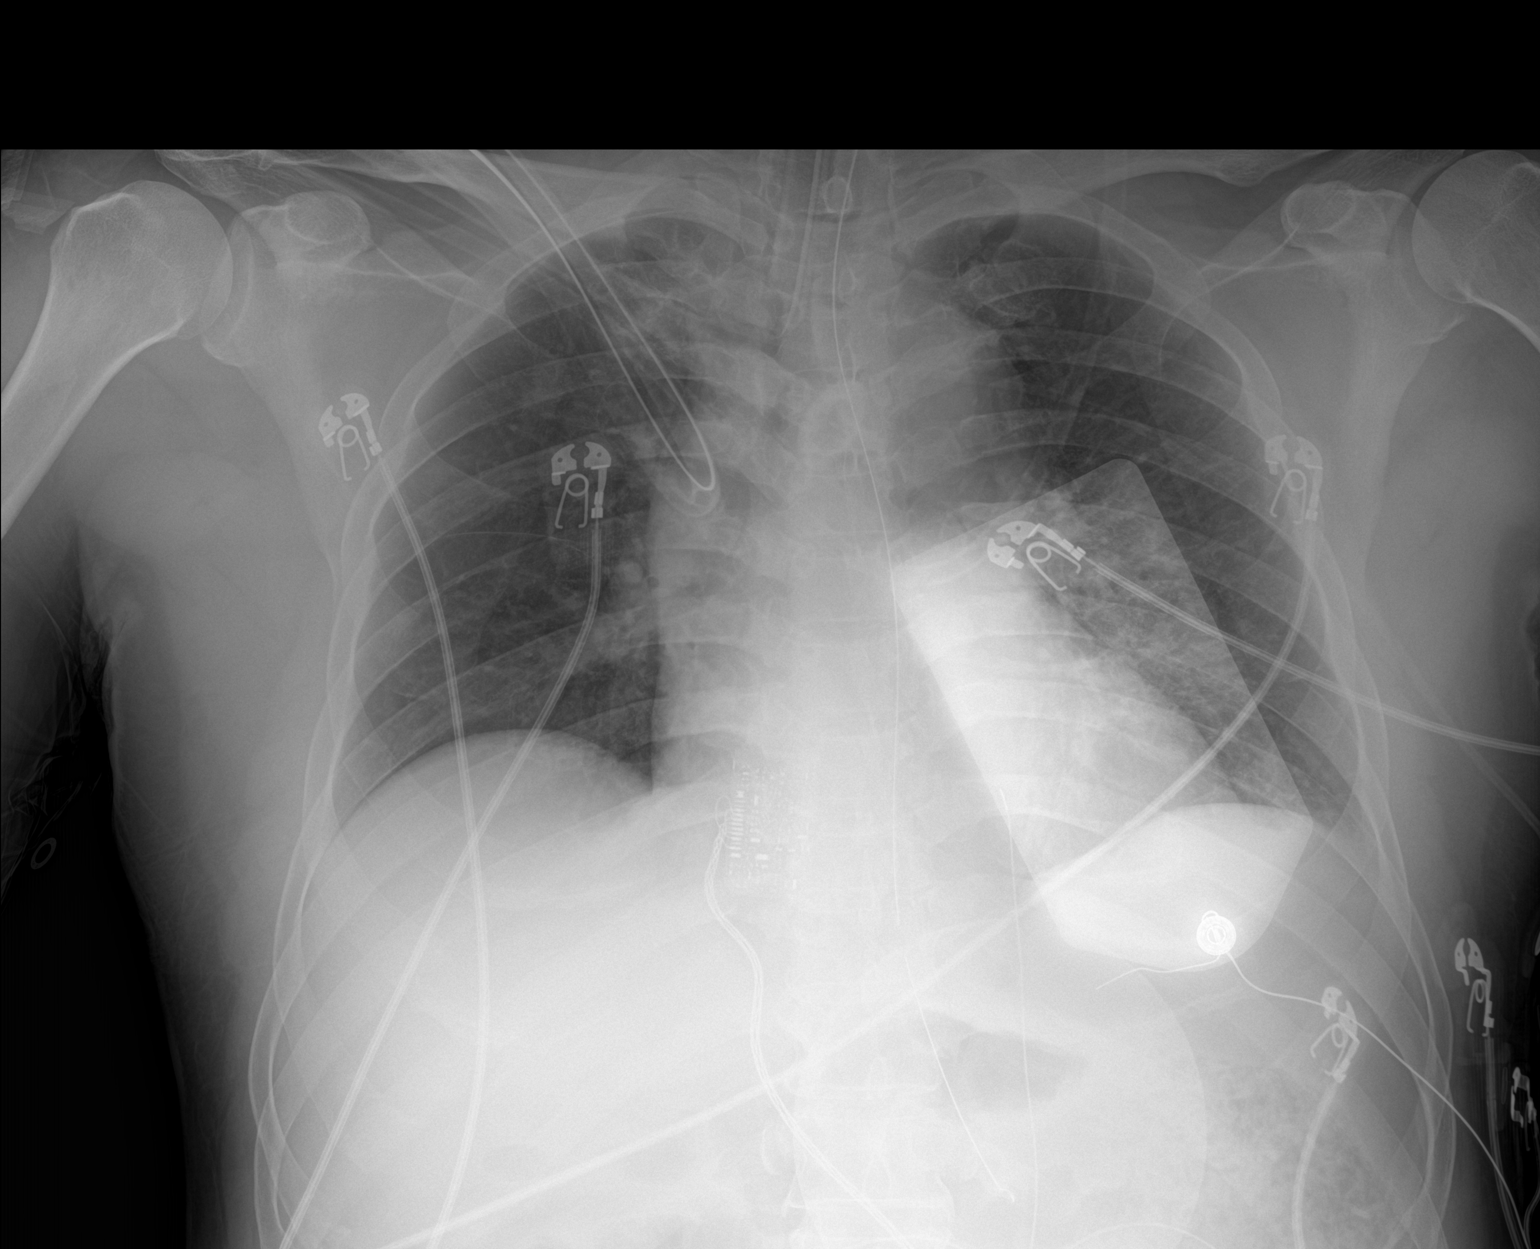

[1 of 1 positions shown; findings below may reference images not displayed]

FINDINGS: Endotracheal tube with tip overlying the midthoracic trachea.
Nasogastric tube coursing below the diaphragm with tip overlying
stomach. Left-sided defibrillator pads. EKG leads and cardiac marked
ring device overlie the chest. The heart size and mediastinal
contours are partially obscured. The visualized skeletal structures
are unremarkable.
IMPRESSION: Endotracheal tube with tip overlying the midthoracic trachea.

Nasogastric tube coursing below the diaphragm with tip overlying
stomach and side port likely at the GE junction recommend 3 cm
advanced.
